# Patient Record
Sex: Male | Born: 1960
Health system: Southern US, Community
[De-identification: ages and names within clinical notes are randomized; demographics above are authoritative.]

## PROBLEM LIST (undated history)

## (undated) DIAGNOSIS — C801 Malignant (primary) neoplasm, unspecified: Secondary | ICD-10-CM

## (undated) HISTORY — PX: CERVICAL SPINE SURGERY: SHX589

---

## 2002-11-07 ENCOUNTER — Emergency Department (HOSPITAL_COMMUNITY): Admission: EM | Admit: 2002-11-07 | Discharge: 2002-11-07 | Payer: Self-pay | Admitting: Emergency Medicine

## 2002-11-07 ENCOUNTER — Encounter: Payer: Self-pay | Admitting: Emergency Medicine

## 2002-12-28 ENCOUNTER — Encounter: Payer: Self-pay | Admitting: Emergency Medicine

## 2002-12-28 ENCOUNTER — Emergency Department (HOSPITAL_COMMUNITY): Admission: EM | Admit: 2002-12-28 | Discharge: 2002-12-28 | Payer: Self-pay | Admitting: Emergency Medicine

## 2004-11-09 ENCOUNTER — Ambulatory Visit (HOSPITAL_COMMUNITY): Admission: RE | Admit: 2004-11-09 | Discharge: 2004-11-10 | Payer: Self-pay | Admitting: Neurosurgery

## 2005-06-11 ENCOUNTER — Inpatient Hospital Stay (HOSPITAL_COMMUNITY): Admission: RE | Admit: 2005-06-11 | Discharge: 2005-06-13 | Payer: Self-pay | Admitting: Urology

## 2005-06-11 ENCOUNTER — Encounter (INDEPENDENT_AMBULATORY_CARE_PROVIDER_SITE_OTHER): Payer: Self-pay | Admitting: *Deleted

## 2005-09-05 ENCOUNTER — Emergency Department (HOSPITAL_COMMUNITY): Admission: EM | Admit: 2005-09-05 | Discharge: 2005-09-05 | Payer: Self-pay | Admitting: Emergency Medicine

## 2005-10-07 DIAGNOSIS — C801 Malignant (primary) neoplasm, unspecified: Secondary | ICD-10-CM

## 2005-10-07 HISTORY — PX: NEPHRECTOMY: SHX65

## 2005-10-07 HISTORY — DX: Malignant (primary) neoplasm, unspecified: C80.1

## 2005-12-19 ENCOUNTER — Ambulatory Visit (HOSPITAL_COMMUNITY): Admission: RE | Admit: 2005-12-19 | Discharge: 2005-12-19 | Payer: Self-pay | Admitting: Urology

## 2006-03-21 ENCOUNTER — Ambulatory Visit (HOSPITAL_COMMUNITY): Admission: RE | Admit: 2006-03-21 | Discharge: 2006-03-21 | Payer: Self-pay | Admitting: Urology

## 2006-10-01 ENCOUNTER — Ambulatory Visit (HOSPITAL_COMMUNITY): Admission: RE | Admit: 2006-10-01 | Discharge: 2006-10-01 | Payer: Self-pay | Admitting: Urology

## 2008-08-18 ENCOUNTER — Ambulatory Visit (HOSPITAL_COMMUNITY): Admission: RE | Admit: 2008-08-18 | Discharge: 2008-08-18 | Payer: Self-pay | Admitting: Urology

## 2008-12-20 ENCOUNTER — Ambulatory Visit (HOSPITAL_COMMUNITY): Admission: RE | Admit: 2008-12-20 | Discharge: 2008-12-20 | Payer: Self-pay | Admitting: Urology

## 2009-10-03 ENCOUNTER — Ambulatory Visit (HOSPITAL_COMMUNITY): Admission: RE | Admit: 2009-10-03 | Discharge: 2009-10-03 | Payer: Self-pay | Admitting: Urology

## 2009-10-07 HISTORY — PX: EYE SURGERY: SHX253

## 2011-02-22 NOTE — H&P (Signed)
NAME:  Billy, Alexander NO.:  [id number]   MEDICAL RECORD NO.:  [id number]          PATIENT TYPE:  OIB   LOCATION:  2854                         FACILITY:  [facility]   PHYSICIAN:  Payton Doughty, M.D.      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   SERVICE:  Neurosurgery.   A 50 year old, right handed, white gentleman, ten days ago he woke up with  pain in his left shoulder and arm.  The pain has progressed down his arm.  He was in [place] and went to a chiropractor with some relief.  He has  numbness in his left thumb, pain down towards the left shoulder and numbness  in various spots in his arm.  The right side has not been effected nor have  the extremities.   MEDICAL HISTORY:  Benign.   He is on hydrocodone only.   He has no allergies.   He has had no operations.   FAMILY HISTORY:  Mom is 70.  Dad is 26.  Both in good health.   SOCIAL HISTORY:  He smokes a pack and a half of cigarettes a day.  He does  not drink alcohol, but he used to.   REVIEW OF SYSTEMS:  Remarkable for arm pain and neck pain.   PHYSICAL EXAMINATION:  HEENT:  Within normal limits.  NECK:  He has limited range of motion neck.  Turning it towards the left  reproduces left shoulder and arm pain.  CHEST:  Clear.  CARDIAC:  Regular rate and rhythm.  ABDOMEN:  Nontender with no hepatosplenomegaly.  EXTREMITIES:  Without clubbing or cyanosis.  Peripheral pulses are good.  GU:  Deferred.  NEUROLOGIC:  He is awake, alert, and oriented.  His cranial nerves are  intact.  Motor exam shows 5/5 strength throughout the upper and lower  extremities, save for the left biceps which is 5-/5.  He has a left C6  sensory deficit.  Reflexes are 2 at the right biceps, flicker at the left, 1  at the triceps bilaterally, 1 at the brachial radialis bilaterally.  Hoffmann's is negative and the lower extremities are non myelopathic.   __________  MRI shows mild degenerative changes at C3-C4 and C4-C5.  C5-C6  has a large herniated disk __________  the left side and in the left C6  neuroforamen with compression of the left C6 root.   CLINICAL IMPRESSION:  Left C6 radiculopathy secondary to herniated disk.   PLAN:  Anterior cervical decompression and fusion with a Reflex Hybrid  plate.  The risks and benefits of this approach have been discussed with him  and he wishes to proceed.      MWR/MEDQ  D:  11/09/2004  T:  11/09/2004  Job:  [id number]

## 2011-02-22 NOTE — Discharge Summary (Signed)
NAME:  Billy Alexander, Billy Alexander NO.:  [id number]   MEDICAL RECORD NO.:  [id number]          PATIENT TYPE:  INP   LOCATION:  1429                         FACILITY:  [facility]   PHYSICIAN:  Excell Seltzer. Annabell Howells, M.D.    DATE OF BIRTH:  05/13/1961   DATE OF ADMISSION:  06/11/2005  DATE OF DISCHARGE:  06/13/2005                                 DISCHARGE SUMMARY   DISCHARGE DIAGNOSIS:  Left renal mass.   PROCEDURE:  Left hand-assisted laparoscopic nephrectomy.   SURGEON:  Excell Seltzer. Annabell Howells, M.D.   CONSULTATIONS:  None.   HOSPITAL COURSE:  The patient was taken to the operating room on June 11, 2005, and underwent the above-named procedure, which he tolerated well  without complications.  For a complete description of this procedure, please  consult the operative note.  Postoperatively, he was taken to PACU and then  to the floor in stable condition, where he remained throughout his hospital  stay, without incident, which consisted of progressive toleration of  ambulation, toleration of pain, and diet.  On postoperative day number two,  it was determined that he was in stable condition to be discharged home.  On  examination at the time of discharge, the abdomen was soft, nondistended,  nontender to palpation, without costovertebral angle tenderness.  The  incision was clean, dry, and intact.  The staples were in place.  There was  no surrounding erythema or exudate.   DISCHARGE INSTRUCTIONS:  The patient was given routine wound care  instructions and instructed to call or return if he begins experiencing any  nausea, vomiting, fever, chills, redness, or drainage from his wound.  He  was instructed not to lift more than 10 pounds or strain for the next six  weeks.  He was instructed not to drive on narcotic pain medicine or if he  has abdominal soreness.  He was instructed to followup with Dr. Annabell Howells as  scheduled for wound check and staple removal.  He understands and agrees  with  these instructions.   DISCHARGE MEDICATIONS:  1.  Tylox.  2.  Colace.     ______________________________  Glade Nurse, MD      Excell Seltzer. Annabell Howells, M.D.  Electronically Signed    MT/MEDQ  D:  06/13/2005  T:  06/13/2005  Job:  [id number]

## 2011-02-22 NOTE — Op Note (Signed)
NAME:  Billy Alexander, Billy Alexander NO.:  [id number]   MEDICAL RECORD NO.:  [id number]          PATIENT TYPE:  INP   LOCATION:  X003                         FACILITY:  [facility]   PHYSICIAN:  Excell Seltzer. Annabell Howells, M.D.    DATE OF BIRTH:  Jan 11, 1961   DATE OF PROCEDURE:  06/11/2005  DATE OF DISCHARGE:                                 OPERATIVE REPORT   PREOPERATIVE DIAGNOSIS:  Left renal mass.   POSTOPERATIVE DIAGNOSIS:  Left renal mass.   OPERATION PERFORMED:  Left hand assisted laparoscopic nephrectomy.   SURGEON:  Excell Seltzer. Annabell Howells, M.D.   ASSISTANT:  Glade Nurse, MD   ANESTHESIA:  General endotracheal.   SPECIMENS:  Left kidney.   DRAINS:  None.   DESCRIPTION OF PROCEDURE:  The patient was identified by wrist bracelet and  brought to room 10 where he received preoperative antibiotics and was  prepped and draped in normal fashion.  Next, we made a 7.5 cm midline  incision just lateral to the left of the umbilicus.  We dissected down  through the dermis, subcutaneous fat.  The midline was identified and  divided with Bovie cautery.  We then identified the medial aspect of the  each rectus body and divided them bluntly.  We picked up on the  preperitoneal fat and peritoneum divided then with Metzenbaum scissors.  The  peritoneum was entered and the remainder of the incision was then opened.  Next, Gelport was placed and a pneumoperitoneum was established by placing a  trocar through the Gelport.  Next, we made a port site for a 12.5 mm port  along the midline just inferior to the xiphoid process.  We made a  transverse 0.5 cm incision, dissected down with Bovie cautery and hemostat.  We then placed a trocar, bluntly entered under direct visual guidance.  We  then repeated this process with a second 12.5 mm port one handbreadth  lateral to the Gelport on the left.  Then we made a transverse 0.5 cm  incision, dissected down with Bovie cautery and placed the trocar bluntly  under  direct visualization. Next, the camera was removed from the Gelport  and placed in the upper midline port site.  The surgeon's left hand was  placed through the Gelport.  Next, the patient was rolled listed to the  right.  Next, using cautery hook we scored the white line of Toldt from the  splenic flexure to the level of the pelvic brim.  The colon was then  reflected medially which was aided by the angle of the table.  Next, we  developed a plane bluntly between the edge that was just divided along the  white line of Told, developed a plane with the surgeon's hand, between the  colonic mesentery and the underlying Gerota's fascia.  This was done in a  blunt fashion extending from the white line of Toldt medially to the level  of the hilum and from the superior to the inferior pole of the kidney.  Next, we starting laterally, we divided across the lower pole tissue with a  Harmonic scalpel. We then  encountered the ureter, which was dissected out  with right angle and ligated with Weck clips and divided sharply.  Next, we  followed the course of the ureter toward the hilum. The gonadal vessels were  identified and divided with the Harmonic scalpel.  Next, we encountered the  lower pole artery and vein.  These were dissected out with right angle clamp  and were ligated with Weck clips placing two proximally and one clip  distally. They were each then divided with Metzenbaum scissors.  We then  continued blunt dissection cranial fashion with both the surgeon's left hand  and a Nezhat.  We then were able to identify the anterior renal vein as well  as a posterior lying single renal artery.  We were able to develop a window  circumferentially around the hilum and using a ATW-45 stapler, we ligated  and divided the hilum.  We then inspected the hilum and it was found to be  hemostatic.  Next, there were attachments remaining to the upper pole  superior to the adrenal gland.  These were taken down  bluntly and with  Harmonics scalpel.  At this point the entire kidney including the adrenal  gland and Gerota's fascia and proximal ureter was free. We were unable to  gently remove it through the hand port.  At this point under direct  visualization, we removed our 12 mm ports.  The port sites were hemostatic.  We then removed the Gelport and the kidney was easily removed and passed off  the field for pathologic analysis.  Following this, we inspected.  All  sponge counts were done.  Sponge and instrument counts were done and were  correct.  Next, we closed the fascia and the 12 mm ports with interrupted 0  Vicryls, staples were applied to the level of the skin.  Next, the midline  incision was closed with running #2 PDS being careful not to involve the  underlying bowel in the repair.  Staples were applied. Skin dressings were  applied.  The patient was reversed from his anesthesia which was tolerated  without complication.  Please note, Dr. Bjorn Pippin was present and  participated in all aspects of the case.     ______________________________  Glade Nurse, MD      Excell Seltzer. Annabell Howells, M.D.  Electronically Signed    MT/MEDQ  D:  06/11/2005  T:  06/11/2005  Job:  [id number]

## 2011-02-22 NOTE — Op Note (Signed)
NAME:  Billy Alexander, CROCHET NO.:  [id number]   MEDICAL RECORD NO.:  [id number]          PATIENT TYPE:  OIB   LOCATION:  3013                         FACILITY:  [facility]   PHYSICIAN:  Payton Doughty, M.D.      DATE OF BIRTH:  June 14, 1961   DATE OF PROCEDURE:  11/09/2004  DATE OF DISCHARGE:  11/10/2004                                 OPERATIVE REPORT   PREOPERATIVE DIAGNOSIS:  Herniated disk, C5-6, left.   POSTOPERATIVE DIAGNOSIS:  Herniated disk, C5-6, left.   PROCEDURE:  C5-C6 anterior cervical decompression and fusion with a Reflex  hybrid plate.   SURGEON:  Payton Doughty, M.D.   FIRST ASSISTANT:  Cristi Loron, M.D.   ASSISTANTBasilia Jumbo, Nurse Assistant.   ANESTHESIA:  General endotracheal anesthesia.   PREPARATION:  The patient was prepped with Hibiclens and alcohol wipes.   COMPLICATIONS:  None.   BODY OF TEXT:  A 50 year old gentleman, left C6 radiculopathy secondary to  herniated disk was taken to the operating room and after smooth induction  anesthetic, was intubated.   The patient was placed supine on the operating room table, with site shaved,  prepped and draped in the usual sterile fashion.   Skin was incised in the midline, medial border of the sternocleidomastoid  muscle on the left side.  The platysma was identified, divided and  undermined.  Sternocleidomastoid was then identified and medial dissection  revealed the carotid artery and retracted right and left, trachea and  esophagus retracted right, exposing the body of the anterior cervical spine.  Marker was placed  and intraoperative x-ray obtained to confirm correct  position and level, and after confirming correct level, diskectomy was  carried out under gross observation.   The operating microscope was then brought in and we used microdissection  technique to remove the remaining disk, divided the posterior longitudinal  ligament.  Under the ligament, way out in the left neural  foramina, were  large fragments of disk that were removed without difficulty.  This resulted  in immediate decompression of the nerve root.   Following  complete decompression, a 7-mm bone graft was fashioned from  patellar allograft and tapped into place.  A 14-mm Reflex hybrid plate was  then placed with 12-mm screws, two in C5 and two in C6.  Intraoperative x-  rays confirmed good placement of bone graft plate and screws.  The wound was  irrigated, hemostasis assured.  The  platysma, subcutaneous tissue was reapproximated 3-0 Vicryl interrupted  fashion. Skin was closed with 4-0 Vicryl in a running subcuticular fashion.  Benzoin and Steri-Strips were placed, as well as occlusive Telfa and OpSite  and the patient was placed in Aspen collar and returned to the recovery room  in good condition.      MWR/MEDQ  D:  11/09/2004  T:  11/10/2004  Job:  [id number]

## 2011-07-09 LAB — CREATININE, SERUM: Creatinine, Ser: 1.14

## 2011-09-14 ENCOUNTER — Ambulatory Visit (INDEPENDENT_AMBULATORY_CARE_PROVIDER_SITE_OTHER): Payer: Commercial Managed Care - PPO

## 2011-09-14 DIAGNOSIS — Z23 Encounter for immunization: Secondary | ICD-10-CM

## 2011-09-14 DIAGNOSIS — S61209A Unspecified open wound of unspecified finger without damage to nail, initial encounter: Secondary | ICD-10-CM

## 2012-02-11 ENCOUNTER — Other Ambulatory Visit: Payer: Self-pay | Admitting: Internal Medicine

## 2012-08-29 ENCOUNTER — Telehealth: Payer: Self-pay

## 2012-08-29 ENCOUNTER — Ambulatory Visit (INDEPENDENT_AMBULATORY_CARE_PROVIDER_SITE_OTHER): Payer: Commercial Managed Care - PPO | Admitting: Emergency Medicine

## 2012-08-29 VITALS — BP 135/82 | HR 66 | Temp 97.6°F | Resp 16 | Ht 70.5 in | Wt 182.0 lb

## 2012-08-29 DIAGNOSIS — J029 Acute pharyngitis, unspecified: Secondary | ICD-10-CM

## 2012-08-29 DIAGNOSIS — J40 Bronchitis, not specified as acute or chronic: Secondary | ICD-10-CM

## 2012-08-29 DIAGNOSIS — F17200 Nicotine dependence, unspecified, uncomplicated: Secondary | ICD-10-CM | POA: Insufficient documentation

## 2012-08-29 DIAGNOSIS — J329 Chronic sinusitis, unspecified: Secondary | ICD-10-CM

## 2012-08-29 MED ORDER — PREDNISONE 10 MG PO TABS: ORAL_TABLET | ORAL | Status: DC

## 2012-08-29 MED ORDER — HYDROCODONE-HOMATROPINE 5-1.5 MG/5ML PO SYRP: 5.0000 mL | ORAL_SOLUTION | Freq: Four times a day (QID) | ORAL | Status: DC | PRN

## 2012-08-29 MED ORDER — AMOXICILLIN-POT CLAVULANATE 875-125 MG PO TABS: 1.0000 | ORAL_TABLET | Freq: Two times a day (BID) | ORAL | Status: DC

## 2012-08-29 MED ORDER — METHYLPREDNISOLONE ACETATE 80 MG/ML IJ SUSP
80.0000 mg | Freq: Once | INTRAMUSCULAR | Status: AC
  Administered 2012-08-29: 80 mg via INTRAMUSCULAR

## 2012-08-29 NOTE — Telephone Encounter (Signed)
Patient was seen today and called back to ask doctor to call in prescription  For Prednisone.  [facility] [address] Please let pt know when done.  [phone]

## 2012-08-29 NOTE — Telephone Encounter (Signed)
No, Dr Cleta Alberts has decided after further review to go ahead and send in the prednisone taper but he wants prednisone 10mg  6 5 4 3 2 1  this is sent in for him, I will advise him when he calls back.

## 2012-08-29 NOTE — Progress Notes (Signed)
  Subjective:    Patient ID: KAMALI NEPHEW, male    DOB: 03-06-1961, 51 y.o.   MRN: [id number]  HPI Patient enters with onset Wednesday. Patient started with a sore throat then developed significant nasal congestion with pain around his right eye and into his right ear. Had mainly clear drainage with some yellowish tinge. He has developed a productive cough. He has a history of seasonal allergies and has a lot of difficulty this time a year. Prior to the onset of symptoms he is cutting down needs and then developed the symptoms he described this morning. He is a heavy cigarette smoker and has no intentions of quitting.   Review of Systems     Objective:   Physical Exam HEENT exam reveals an alert cooperative male in no distress. The left TM is normal the right TM is normal the nose is congested with some purulence. The posterior pharynx is red with some redness of the uvula. There is no cervical adenopathy his chest exam was clear to auscultation and percussion.  Results for orders placed in visit on 08/29/12  POCT RAPID STREP A (OFFICE)      Component Value Range   Rapid Strep A Screen Negative  Negative        Assessment & Plan:  Patient here with allergic rhinitis and secondary sinusitis. We'll give 80 of Depo-Medrol IM followed by Augmentin and hydrocodone for cough. As discussed above he has no intentions to trying to quit smoking

## 2012-08-29 NOTE — Telephone Encounter (Signed)
I have spoken to Dr Cleta Alberts. He wants patient to try to wait 2 days, on ABX and after Depo Medrol to see how he is prior to starting prednisone taper, if patient is not better, he is to call me back and I can send in prednisone 20 mg taper, [phone] Billy Alexander    Left message for patient to call back so I can advise.

## 2012-08-29 NOTE — Patient Instructions (Signed)
Please return to clinic for reevaluation if not better in 2-3 days.

## 2012-08-30 NOTE — Telephone Encounter (Signed)
patient notified and voiced understanding. 

## 2012-08-30 NOTE — Telephone Encounter (Signed)
Left message for patient to return call.

## 2012-09-07 ENCOUNTER — Telehealth: Payer: Self-pay

## 2012-09-07 NOTE — Telephone Encounter (Signed)
Pt states saw dr Cleta Alberts for illness recently. States its in his chest now and asks for something to help.   [facility] [address]  [phone]  bf

## 2012-09-07 NOTE — Telephone Encounter (Signed)
Patient was here 11/23 treated with Augmentin, states he is coughing more now. He finished all antibiotics and has used all of cough meds, he wants another round of antibiotics. He states he is already taking Mucinex/ please advise.

## 2012-09-07 NOTE — Telephone Encounter (Signed)
If he is worsening or not improved after abx and steroids, he needs to RTC for further evaluation

## 2012-09-07 NOTE — Telephone Encounter (Signed)
Patient advised.

## 2013-07-12 ENCOUNTER — Ambulatory Visit: Payer: 59

## 2013-07-12 ENCOUNTER — Ambulatory Visit (INDEPENDENT_AMBULATORY_CARE_PROVIDER_SITE_OTHER): Payer: 59 | Admitting: Family Medicine

## 2013-07-12 VITALS — BP 128/80 | HR 70 | Temp 98.7°F | Resp 16 | Ht 70.5 in | Wt 184.2 lb

## 2013-07-12 DIAGNOSIS — J069 Acute upper respiratory infection, unspecified: Secondary | ICD-10-CM

## 2013-07-12 DIAGNOSIS — M549 Dorsalgia, unspecified: Secondary | ICD-10-CM

## 2013-07-12 MED ORDER — DICLOFENAC SODIUM 75 MG PO TBEC: 75.0000 mg | DELAYED_RELEASE_TABLET | Freq: Every day | ORAL | Status: DC

## 2013-07-12 MED ORDER — PREDNISONE 20 MG PO TABS: ORAL_TABLET | ORAL | Status: DC

## 2013-07-12 MED ORDER — METHYLPREDNISOLONE ACETATE 80 MG/ML IJ SUSP
80.0000 mg | Freq: Once | INTRAMUSCULAR | Status: AC
  Administered 2013-07-12: 80 mg via INTRAMUSCULAR

## 2013-07-12 MED ORDER — AMOXICILLIN 875 MG PO TABS: 875.0000 mg | ORAL_TABLET | Freq: Two times a day (BID) | ORAL | Status: DC

## 2013-07-12 NOTE — Patient Instructions (Addendum)
I hope that you feel better.  Start taking the oral prednisone tomorrow.  Do not take diclofenac or other NSAID medications while you are taking prednisone.    Please work on quitting smoking

## 2013-07-12 NOTE — Progress Notes (Signed)
[facility] [address] [place] [address] [phone]  Date:  07/12/2013   Name:  Billy Alexander   DOB:  1960-12-04   MRN:  [id number]  PCP:  Nilda Simmer, MD    Chief Complaint: Allergies   History of Present Illness:  Billy Alexander is a 52 y.o. very pleasant male patient who presents with the following:  "I have allergies.  I get this every year."   Very early yesterday am he noted sneezing, congestion in his right ear.  "I know what I need and if you don't give it to me I'll just be calling back."  He tried benadryl, but it seemed to keep him up last night.  He has not noted a fever or chill No GI symptoms Insists that he must have a shot of steroids, antibiotics ("at least 750 of amox") and oral prednisone.    He also requests diclofenac for inflammation.  He takes 75 once day for pain in his back, neck.  Requests a years rx  Patient Active Problem List   Diagnosis Date Noted  . Smoker 08/29/2012    History reviewed. No pertinent past medical history.  History reviewed. No pertinent past surgical history.  History  Substance Use Topics  . Smoking status: Current Every Day Smoker  . Smokeless tobacco: Not on file  . Alcohol Use: Not on file    History reviewed. No pertinent family history.  No Known Allergies  Medication list has been reviewed and updated.  Current Outpatient Prescriptions on File Prior to Visit  Medication Sig Dispense Refill  . amoxicillin-clavulanate (AUGMENTIN) 875-125 MG per tablet Take 1 tablet by mouth 2 (two) times daily.  20 tablet  0  . HYDROcodone-homatropine (HYCODAN) 5-1.5 MG/5ML syrup Take 5 mLs by mouth every 6 (six) hours as needed for cough.  120 mL  0  . predniSONE (DELTASONE) 10 MG tablet 6/ 5/ 4/ 3 /2/ 1  21 tablet  0  . VIAGRA 100 MG tablet TAKE 1/2 TABLET 1 HOUR PRIOR TO NEED  10 tablet  0   No current facility-administered medications on file prior to visit.    Review of  Systems:  As per HPI- otherwise negative.   Physical Examination: Filed Vitals:   07/12/13 0750  BP: 128/80  Pulse: 70  Temp: 98.7 F (37.1 C)  Resp: 16   Filed Vitals:   07/12/13 0750  Height: 5' 10.5" (1.791 m)  Weight: 184 lb 3.2 oz (83.553 kg)   Body mass index is 26.05 kg/(m^2). Ideal Body Weight: Weight in (lb) to have BMI = 25: 176.4  GEN: WDWN, NAD, Non-toxic, A & O x 3, looks well, tobacco odor HEENT: Atraumatic, Normocephalic. Neck supple. No masses, No LAD.  Bilateral TM wnl, oropharynx normal.  PEERL,EOMI.  Nasal cavity normal Ears and Nose: No external deformity. CV: RRR, No M/G/R. No JVD. No thrill. No extra heart sounds. PULM: CTA B, no wheezes, crackles, rhonchi. No retractions. No resp. distress. No accessory muscle use. EXTR: No c/c/e NEURO Normal gait.  PSYCH: Normally interactive. Conversant. Not depressed or anxious appearing.  Calm demeanor.    Assessment and Plan: Upper respiratory infection - Plan: amoxicillin (AMOXIL) 875 MG tablet, predniSONE (DELTASONE) 20 MG tablet, methylPREDNISolone acetate (DEPO-MEDROL) injection 80 mg  Back pain - Plan: diclofenac (VOLTAREN) 75 MG EC tablet  Pt here with one day of sneezing.  Insistent that he must have depo- medrol, prednisone and amoxicillin.  Treated as per  his request.    Signed Abbe Amsterdam, MD

## 2013-09-15 ENCOUNTER — Ambulatory Visit (INDEPENDENT_AMBULATORY_CARE_PROVIDER_SITE_OTHER): Payer: 59 | Admitting: Emergency Medicine

## 2013-09-15 VITALS — BP 135/72 | HR 64 | Temp 98.2°F | Resp 18 | Wt 191.0 lb

## 2013-09-15 DIAGNOSIS — J329 Chronic sinusitis, unspecified: Secondary | ICD-10-CM

## 2013-09-15 DIAGNOSIS — J069 Acute upper respiratory infection, unspecified: Secondary | ICD-10-CM

## 2013-09-15 MED ORDER — PREDNISONE 20 MG PO TABS: ORAL_TABLET | ORAL | Status: DC

## 2013-09-15 MED ORDER — AMOXICILLIN 875 MG PO TABS: 875.0000 mg | ORAL_TABLET | Freq: Two times a day (BID) | ORAL | Status: DC

## 2013-09-15 MED ORDER — FLUTICASONE PROPIONATE 50 MCG/ACT NA SUSP: 2.0000 | Freq: Every day | NASAL | Status: DC

## 2013-09-15 NOTE — Patient Instructions (Signed)
Allergic Rhinitis Allergic rhinitis is when the mucous membranes in the nose respond to allergens. Allergens are particles in the air that cause your body to have an allergic reaction. This causes you to release allergic antibodies. Through a chain of events, these eventually cause you to release histamine into the blood stream (hence the use of antihistamines). Although meant to be protective to the body, it is this release that causes your discomfort, such as frequent sneezing, congestion and an itchy runny nose.  CAUSES  The pollen allergens may come from grasses, trees, and weeds. This is seasonal allergic rhinitis, or "hay fever." Other allergens cause year-round allergic rhinitis (perennial allergic rhinitis) such as house dust mite allergen, pet dander and mold spores.  SYMPTOMS   Nasal stuffiness (congestion).  Runny, itchy nose with sneezing and tearing of the eyes.  There is often an itching of the mouth, eyes and ears. It cannot be cured, but it can be controlled with medications. DIAGNOSIS  If you are unable to determine the offending allergen, skin or blood testing may find it. TREATMENT   Avoid the allergen.  Medications and allergy shots (immunotherapy) can help.  Hay fever may often be treated with antihistamines in pill or nasal spray forms. Antihistamines block the effects of histamine. There are over-the-counter medicines that may help with nasal congestion and swelling around the eyes. Check with your caregiver before taking or giving this medicine. If the treatment above does not work, there are many new medications your caregiver can prescribe. Stronger medications may be used if initial measures are ineffective. Desensitizing injections can be used if medications and avoidance fails. Desensitization is when a patient is given ongoing shots until the body becomes less sensitive to the allergen. Make sure you follow up with your caregiver if problems continue. SEEK MEDICAL  CARE IF:   You develop fever (more than 100.5 F (38.1 C).  You develop a cough that does not stop easily (persistent).  You have shortness of breath.  You start wheezing.  Symptoms interfere with normal daily activities. Document Released: 06/18/2001 Document Revised: 12/16/2011 Document Reviewed: 12/28/2008 [employer] Patient Information 2014 [employer].  

## 2013-09-15 NOTE — Progress Notes (Signed)
   Subjective:    Patient ID: Billy Alexander, male    DOB: Dec 02, 1960, 52 y.o.   MRN: [id number]  HPI Starting Wednesday, right ear is painful and he is having sinus problems. His hearing is bad in that ear anyway. Not having any nasal drainage. +sneezing. He states he is trying to "catch" it before it gets too bad. He is a smoker. No history of lung CA in his family. He uses flonase when he gets really bad. He took a couple claritin this morning. States that if he does not been on prednisone immediately he usually becomes very ill. He is requesting a shot of Depo-Medrol    Review of Systems     Objective:   Physical Exam patient is alert and cooperative he is not ill-appearing. His neck is supple. Chest is symmetrical expansion there are dry rales on the left here turbinates are swollen and congested. His TMs did not show any signs of infection .        Assessment & Plan:  We'll treat with amoxicillin taper dose of prednisone. I did encourage him to consider stopping smoking he has no intention of doing that. He was given Flonase to use a regular basis.

## 2013-12-03 ENCOUNTER — Other Ambulatory Visit (HOSPITAL_COMMUNITY): Payer: Self-pay | Admitting: Orthopaedic Surgery

## 2013-12-03 ENCOUNTER — Ambulatory Visit (HOSPITAL_COMMUNITY): Payer: 59

## 2013-12-03 ENCOUNTER — Other Ambulatory Visit: Payer: Self-pay | Admitting: Orthopaedic Surgery

## 2013-12-03 DIAGNOSIS — M542 Cervicalgia: Secondary | ICD-10-CM

## 2013-12-04 ENCOUNTER — Ambulatory Visit
Admission: RE | Admit: 2013-12-04 | Discharge: 2013-12-04 | Disposition: A | Discharge status: Home / Self Care | Payer: 59 | Source: Ambulatory Visit | Attending: Orthopaedic Surgery | Admitting: Orthopaedic Surgery

## 2013-12-04 ENCOUNTER — Other Ambulatory Visit: Payer: Self-pay

## 2013-12-04 ENCOUNTER — Other Ambulatory Visit: Payer: 59

## 2013-12-04 DIAGNOSIS — M542 Cervicalgia: Secondary | ICD-10-CM

## 2013-12-14 ENCOUNTER — Ambulatory Visit (HOSPITAL_COMMUNITY): Payer: Self-pay

## 2014-02-16 ENCOUNTER — Other Ambulatory Visit: Payer: Self-pay | Admitting: Neurosurgery

## 2014-02-16 DIAGNOSIS — M549 Dorsalgia, unspecified: Secondary | ICD-10-CM

## 2014-02-18 ENCOUNTER — Ambulatory Visit
Admission: RE | Admit: 2014-02-18 | Discharge: 2014-02-18 | Disposition: A | Discharge status: Home / Self Care | Payer: 59 | Source: Ambulatory Visit | Attending: Neurosurgery | Admitting: Neurosurgery

## 2014-02-18 DIAGNOSIS — M549 Dorsalgia, unspecified: Secondary | ICD-10-CM

## 2014-02-23 ENCOUNTER — Other Ambulatory Visit: Payer: Self-pay | Admitting: Neurosurgery

## 2014-03-08 ENCOUNTER — Encounter (HOSPITAL_COMMUNITY): Payer: Self-pay

## 2014-03-09 ENCOUNTER — Other Ambulatory Visit (HOSPITAL_COMMUNITY): Payer: Self-pay | Admitting: *Deleted

## 2014-03-09 NOTE — Pre-Procedure Instructions (Signed)
Billy Alexander  03/09/2014   Your procedure is scheduled on:  Friday, March 18, 2014 at 7:30 AM.   Report to [facility] Entrance "A" Admitting Office at 5:30 AM.   Call this number if you have problems the morning of surgery: [phone]   Remember:   Do not eat food or drink liquids after midnight Thursday, 03/18/14.   Take these medicines the morning of surgery with A SIP OF WATER: Flonase - if needed, Artificial Tears - if needed.   Do not wear jewelry.  Do not wear lotions, powders, or cologne. You may wear deodorant.  Men may shave face and neck.  Do not bring valuables to the hospital.  [facility] is not responsible                  for any belongings or valuables.               Contacts, dentures or bridgework may not be worn into surgery.  Leave suitcase in the car. After surgery it may be brought to your room.  For patients admitted to the hospital, discharge time is determined by your                treatment team.          Special Instructions: [place] - Preparing for Surgery  Before surgery, you can play an important role.  Because skin is not sterile, your skin needs to be as free of germs as possible.  You can reduce the number of germs on you skin by washing with CHG (chlorahexidine gluconate) soap before surgery.  CHG is an antiseptic cleaner which kills germs and bonds with the skin to continue killing germs even after washing.  Please DO NOT use if you have an allergy to CHG or antibacterial soaps.  If your skin becomes reddened/irritated stop using the CHG and inform your nurse when you arrive at Short Stay.  Do not shave (including legs and underarms) for at least 48 hours prior to the first CHG shower.  You may shave your face.  Please follow these instructions carefully:   1.  Shower with CHG Soap the night before surgery and the                                morning of Surgery.  2.  If you choose to wash your hair, wash your hair first as usual  with your       normal shampoo.  3.  After you shampoo, rinse your hair and body thoroughly to remove the                      Shampoo.  4.  Use CHG as you would any other liquid soap.  You can apply chg directly       to the skin and wash gently with scrungie or a clean washcloth.  5.  Apply the CHG Soap to your body ONLY FROM THE NECK DOWN.        Do not use on open wounds or open sores.  Avoid contact with your eyes, ears, mouth and genitals (private parts).  Wash genitals (private parts) with your normal soap.  6.  Wash thoroughly, paying special attention to the area where your surgery        will be performed.  7.  Thoroughly rinse your body with warm  water from the neck down.  8.  DO NOT shower/wash with your normal soap after using and rinsing off       the CHG Soap.  9.  Pat yourself dry with a clean towel.            10.  Wear clean pajamas.            11.  Place clean sheets on your bed the night of your first shower and do not        sleep with pets.  Day of Surgery  Do not apply any lotions the morning of surgery.  Please wear clean clothes to the hospital/surgery center.     Please read over the following fact sheets that you were given: Pain Booklet, Coughing and Deep Breathing, Blood Transfusion Information, MRSA Information and Surgical Site Infection Prevention

## 2014-03-10 ENCOUNTER — Encounter (HOSPITAL_COMMUNITY)
Admission: RE | Admit: 2014-03-10 | Discharge: 2014-03-10 | Disposition: A | Discharge status: Home / Self Care | Payer: 59 | Source: Ambulatory Visit | Attending: Neurosurgery | Admitting: Neurosurgery

## 2014-03-10 ENCOUNTER — Encounter (HOSPITAL_COMMUNITY): Payer: Self-pay

## 2014-03-10 DIAGNOSIS — Z01812 Encounter for preprocedural laboratory examination: Secondary | ICD-10-CM | POA: Insufficient documentation

## 2014-03-10 HISTORY — DX: Malignant (primary) neoplasm, unspecified: C80.1

## 2014-03-10 LAB — BASIC METABOLIC PANEL
BUN: 12 mg/dL (ref 6–23)
CO2: 25 mEq/L (ref 19–32)
Calcium: 9.8 mg/dL (ref 8.4–10.5)
Chloride: 104 mEq/L (ref 96–112)
Creatinine, Ser: 0.83 mg/dL (ref 0.50–1.35)
Glucose, Bld: 106 mg/dL — ABNORMAL HIGH (ref 70–99)
POTASSIUM: 4.8 meq/L (ref 3.7–5.3)
Sodium: 143 mEq/L (ref 137–147)

## 2014-03-10 LAB — CBC
HCT: 45.6 % (ref 39.0–52.0)
HEMOGLOBIN: 15.4 g/dL (ref 13.0–17.0)
MCH: 32.1 pg (ref 26.0–34.0)
MCHC: 33.8 g/dL (ref 30.0–36.0)
MCV: 95 fL (ref 78.0–100.0)
Platelets: 235 10*3/uL (ref 150–400)
RBC: 4.8 MIL/uL (ref 4.22–5.81)
RDW: 13.1 % (ref 11.5–15.5)
WBC: 5.8 10*3/uL (ref 4.0–10.5)

## 2014-03-10 LAB — TYPE AND SCREEN
ABO/RH(D): O NEG
Antibody Screen: NEGATIVE

## 2014-03-10 LAB — ABO/RH: ABO/RH(D): O NEG

## 2014-03-10 LAB — SURGICAL PCR SCREEN
MRSA, PCR: NEGATIVE
STAPHYLOCOCCUS AUREUS: NEGATIVE

## 2014-03-17 MED ORDER — CEFAZOLIN SODIUM-DEXTROSE 2-3 GM-% IV SOLR
2.0000 g | INTRAVENOUS | Status: AC
  Administered 2014-03-18: 2 g via INTRAVENOUS
  Filled 2014-03-17: qty 50

## 2014-03-17 MED ORDER — DEXAMETHASONE SODIUM PHOSPHATE 10 MG/ML IJ SOLN
10.0000 mg | INTRAMUSCULAR | Status: AC
  Administered 2014-03-18: 10 mg via INTRAVENOUS
  Filled 2014-03-17: qty 1

## 2014-03-18 ENCOUNTER — Encounter (HOSPITAL_COMMUNITY)
Admission: RE | Disposition: A | Discharge status: Home / Self Care | Payer: Self-pay | Source: Ambulatory Visit | Attending: Neurosurgery

## 2014-03-18 ENCOUNTER — Inpatient Hospital Stay (HOSPITAL_COMMUNITY): Payer: 59 | Admitting: Anesthesiology

## 2014-03-18 ENCOUNTER — Encounter (HOSPITAL_COMMUNITY): Payer: Self-pay | Admitting: *Deleted

## 2014-03-18 ENCOUNTER — Inpatient Hospital Stay (HOSPITAL_COMMUNITY): Payer: 59

## 2014-03-18 ENCOUNTER — Encounter (HOSPITAL_COMMUNITY): Payer: 59 | Admitting: Anesthesiology

## 2014-03-18 ENCOUNTER — Inpatient Hospital Stay (HOSPITAL_COMMUNITY)
Admission: RE | Admit: 2014-03-18 | Discharge: 2014-03-21 | DRG: 458 | Disposition: A | Discharge status: Home / Self Care | Payer: 59 | Source: Ambulatory Visit | Attending: Neurosurgery | Admitting: Neurosurgery

## 2014-03-18 DIAGNOSIS — M48061 Spinal stenosis, lumbar region without neurogenic claudication: Secondary | ICD-10-CM | POA: Diagnosis present

## 2014-03-18 DIAGNOSIS — M4120 Other idiopathic scoliosis, site unspecified: Principal | ICD-10-CM | POA: Diagnosis present

## 2014-03-18 DIAGNOSIS — M419 Scoliosis, unspecified: Secondary | ICD-10-CM | POA: Diagnosis present

## 2014-03-18 DIAGNOSIS — F17200 Nicotine dependence, unspecified, uncomplicated: Secondary | ICD-10-CM | POA: Diagnosis present

## 2014-03-18 DIAGNOSIS — Z981 Arthrodesis status: Secondary | ICD-10-CM

## 2014-03-18 SURGERY — POSTERIOR LUMBAR FUSION 3 LEVEL
Anesthesia: General | Site: Back

## 2014-03-18 MED ORDER — SUCCINYLCHOLINE CHLORIDE 20 MG/ML IJ SOLN
INTRAMUSCULAR | Status: AC
  Filled 2014-03-18: qty 1

## 2014-03-18 MED ORDER — FENTANYL CITRATE 0.05 MG/ML IJ SOLN
INTRAMUSCULAR | Status: AC
  Filled 2014-03-18: qty 5

## 2014-03-18 MED ORDER — NEOSTIGMINE METHYLSULFATE 10 MG/10ML IV SOLN
INTRAVENOUS | Status: DC | PRN
  Administered 2014-03-18: 2.5 mg via INTRAVENOUS

## 2014-03-18 MED ORDER — CEFAZOLIN SODIUM-DEXTROSE 2-3 GM-% IV SOLR
INTRAVENOUS | Status: DC | PRN
  Administered 2014-03-18: 2 g via INTRAVENOUS

## 2014-03-18 MED ORDER — HYDROMORPHONE HCL PF 1 MG/ML IJ SOLN
0.5000 mg | INTRAMUSCULAR | Status: DC | PRN
  Administered 2014-03-18: 0.5 mg via INTRAVENOUS

## 2014-03-18 MED ORDER — HYDROMORPHONE HCL PF 1 MG/ML IJ SOLN
0.2500 mg | INTRAMUSCULAR | Status: DC | PRN
  Administered 2014-03-18 (×5): 0.5 mg via INTRAVENOUS

## 2014-03-18 MED ORDER — LIDOCAINE HCL (CARDIAC) 20 MG/ML IV SOLN
INTRAVENOUS | Status: AC
  Filled 2014-03-18: qty 5

## 2014-03-18 MED ORDER — FENTANYL CITRATE 0.05 MG/ML IJ SOLN
Freq: Once | INTRAMUSCULAR | Status: DC
  Filled 2014-03-18: qty 200

## 2014-03-18 MED ORDER — PROPOFOL 10 MG/ML IV BOLUS
INTRAVENOUS | Status: DC | PRN
  Administered 2014-03-18: 50 mg via INTRAVENOUS
  Administered 2014-03-18: 150 mg via INTRAVENOUS

## 2014-03-18 MED ORDER — SODIUM CHLORIDE 0.9 % IV SOLN: 250.0000 mL | INTRAVENOUS | Status: DC

## 2014-03-18 MED ORDER — SODIUM CHLORIDE 0.9 % IR SOLN
Status: DC | PRN
  Administered 2014-03-18 (×2)

## 2014-03-18 MED ORDER — FLUTICASONE PROPIONATE 50 MCG/ACT NA SUSP: 2.0000 | Freq: Every day | NASAL | Status: DC | PRN

## 2014-03-18 MED ORDER — ONDANSETRON HCL 4 MG/2ML IJ SOLN
4.0000 mg | INTRAMUSCULAR | Status: DC | PRN
  Administered 2014-03-19: 4 mg via INTRAVENOUS
  Filled 2014-03-18: qty 2

## 2014-03-18 MED ORDER — DIAZEPAM 5 MG/ML IJ SOLN
2.0000 mg | Freq: Three times a day (TID) | INTRAMUSCULAR | Status: DC | PRN
  Administered 2014-03-18: 2 mg via INTRAVENOUS

## 2014-03-18 MED ORDER — BUPIVACAINE HCL (PF) 0.5 % IJ SOLN
INTRAMUSCULAR | Status: DC | PRN
  Administered 2014-03-18: 20 mL

## 2014-03-18 MED ORDER — MENTHOL 3 MG MT LOZG: 1.0000 | LOZENGE | OROMUCOSAL | Status: DC | PRN

## 2014-03-18 MED ORDER — CYCLOBENZAPRINE HCL 10 MG PO TABS
ORAL_TABLET | ORAL | Status: AC
Start: 2014-03-18 — End: 2014-03-19
  Filled 2014-03-18: qty 1

## 2014-03-18 MED ORDER — PHENOL 1.4 % MT LIQD: 1.0000 | OROMUCOSAL | Status: DC | PRN

## 2014-03-18 MED ORDER — CEFAZOLIN SODIUM-DEXTROSE 2-3 GM-% IV SOLR
2.0000 g | Freq: Three times a day (TID) | INTRAVENOUS | Status: AC
  Administered 2014-03-18 – 2014-03-19 (×2): 2 g via INTRAVENOUS
  Filled 2014-03-18 (×2): qty 50

## 2014-03-18 MED ORDER — SODIUM CHLORIDE 0.9 % IV SOLN: 2500.0000 ug | INTRAVENOUS | Status: DC | PRN

## 2014-03-18 MED ORDER — ALBUMIN HUMAN 5 % IV SOLN
INTRAVENOUS | Status: DC | PRN
  Administered 2014-03-18: 11:00:00 via INTRAVENOUS

## 2014-03-18 MED ORDER — VECURONIUM BROMIDE 10 MG IV SOLR
INTRAVENOUS | Status: AC
  Filled 2014-03-18: qty 20

## 2014-03-18 MED ORDER — ACETAMINOPHEN 650 MG RE SUPP: 650.0000 mg | RECTAL | Status: DC | PRN

## 2014-03-18 MED ORDER — HYDROMORPHONE HCL PF 1 MG/ML IJ SOLN
1.0000 mg | INTRAMUSCULAR | Status: DC | PRN
  Administered 2014-03-18 – 2014-03-19 (×5): 1.5 mg via INTRAMUSCULAR
  Filled 2014-03-18 (×4): qty 2
  Filled 2014-03-18: qty 1
  Filled 2014-03-18: qty 2

## 2014-03-18 MED ORDER — GLYCOPYRROLATE 0.2 MG/ML IJ SOLN
INTRAMUSCULAR | Status: DC | PRN
  Administered 2014-03-18: 0.4 mg via INTRAVENOUS

## 2014-03-18 MED ORDER — CYCLOBENZAPRINE HCL 10 MG PO TABS
10.0000 mg | ORAL_TABLET | Freq: Three times a day (TID) | ORAL | Status: DC | PRN
  Administered 2014-03-18 – 2014-03-19 (×3): 10 mg via ORAL
  Filled 2014-03-18 (×2): qty 1

## 2014-03-18 MED ORDER — MIDAZOLAM HCL 2 MG/2ML IJ SOLN
INTRAMUSCULAR | Status: AC
  Filled 2014-03-18: qty 2

## 2014-03-18 MED ORDER — VECURONIUM BROMIDE 10 MG IV SOLR
INTRAVENOUS | Status: AC
  Filled 2014-03-18: qty 10

## 2014-03-18 MED ORDER — SODIUM CHLORIDE 0.9 % IV SOLN
INTRAVENOUS | Status: DC | PRN
  Administered 2014-03-18 (×4): via INTRAVENOUS

## 2014-03-18 MED ORDER — FENTANYL CITRATE 0.05 MG/ML IJ SOLN
INTRAMUSCULAR | Status: DC | PRN
  Administered 2014-03-18: 50 ug via INTRAVENOUS
  Administered 2014-03-18: 100 ug via INTRAVENOUS
  Administered 2014-03-18: 1250 ug via INTRAVENOUS
  Administered 2014-03-18: 250 ug via INTRAVENOUS
  Administered 2014-03-18 (×2): 50 ug via INTRAVENOUS

## 2014-03-18 MED ORDER — OXYCODONE-ACETAMINOPHEN 5-325 MG PO TABS
1.0000 | ORAL_TABLET | ORAL | Status: DC | PRN
Start: 2014-03-18 — End: 2014-03-21
  Administered 2014-03-18 – 2014-03-19 (×6): 2 via ORAL
  Filled 2014-03-18 (×7): qty 2

## 2014-03-18 MED ORDER — LACTATED RINGERS IV SOLN
INTRAVENOUS | Status: DC | PRN
  Administered 2014-03-18 (×4): via INTRAVENOUS

## 2014-03-18 MED ORDER — 0.9 % SODIUM CHLORIDE (POUR BTL) OPTIME
TOPICAL | Status: DC | PRN
  Administered 2014-03-18: 1000 mL

## 2014-03-18 MED ORDER — HYDROMORPHONE HCL PF 1 MG/ML IJ SOLN
INTRAMUSCULAR | Status: AC
  Administered 2014-03-18: 1 mg
  Filled 2014-03-18: qty 2

## 2014-03-18 MED ORDER — HYDROMORPHONE HCL PF 1 MG/ML IJ SOLN
INTRAMUSCULAR | Status: AC
  Filled 2014-03-18: qty 1

## 2014-03-18 MED ORDER — LIDOCAINE HCL (CARDIAC) 20 MG/ML IV SOLN
INTRAVENOUS | Status: DC | PRN
  Administered 2014-03-18: 60 mg via INTRAVENOUS

## 2014-03-18 MED ORDER — VECURONIUM BROMIDE 10 MG IV SOLR
INTRAVENOUS | Status: DC | PRN
  Administered 2014-03-18: 5 mg via INTRAVENOUS
  Administered 2014-03-18 (×3): 2 mg via INTRAVENOUS
  Administered 2014-03-18: 5 mg via INTRAVENOUS
  Administered 2014-03-18: 16 mg via INTRAVENOUS

## 2014-03-18 MED ORDER — DIAZEPAM 5 MG/ML IJ SOLN
INTRAMUSCULAR | Status: AC
  Filled 2014-03-18: qty 2

## 2014-03-18 MED ORDER — SUCCINYLCHOLINE CHLORIDE 20 MG/ML IJ SOLN
INTRAMUSCULAR | Status: DC | PRN
  Administered 2014-03-18: 120 mg via INTRAVENOUS

## 2014-03-18 MED ORDER — PANTOPRAZOLE SODIUM 40 MG IV SOLR
40.0000 mg | Freq: Every day | INTRAVENOUS | Status: DC
  Filled 2014-03-18 (×2): qty 40

## 2014-03-18 MED ORDER — SODIUM CHLORIDE 0.9 % IJ SOLN
3.0000 mL | Freq: Two times a day (BID) | INTRAMUSCULAR | Status: DC
  Administered 2014-03-19 – 2014-03-21 (×4): 3 mL via INTRAVENOUS

## 2014-03-18 MED ORDER — ACETAMINOPHEN 325 MG PO TABS
650.0000 mg | ORAL_TABLET | ORAL | Status: DC | PRN
  Administered 2014-03-19: 650 mg via ORAL
  Filled 2014-03-18: qty 2

## 2014-03-18 MED ORDER — SODIUM CHLORIDE 0.9 % IJ SOLN: 3.0000 mL | INTRAMUSCULAR | Status: DC | PRN

## 2014-03-18 MED ORDER — STERILE WATER FOR INJECTION IJ SOLN
INTRAMUSCULAR | Status: AC
  Filled 2014-03-18: qty 10

## 2014-03-18 MED ORDER — GLYCOPYRROLATE 0.2 MG/ML IJ SOLN
INTRAMUSCULAR | Status: AC
  Filled 2014-03-18: qty 2

## 2014-03-18 MED ORDER — FENTANYL CITRATE 0.05 MG/ML IJ SOLN: 2500.0000 ug | INTRAMUSCULAR | Status: DC | PRN

## 2014-03-18 MED ORDER — SODIUM CHLORIDE 0.9 % IV SOLN
INTRAVENOUS | Status: DC
  Filled 2014-03-18 (×14): qty 200

## 2014-03-18 MED ORDER — KCL IN DEXTROSE-NACL 20-5-0.45 MEQ/L-%-% IV SOLN
80.0000 mL/h | INTRAVENOUS | Status: DC
  Administered 2014-03-18 – 2014-03-19 (×3): 80 mL/h via INTRAVENOUS
  Filled 2014-03-18 (×7): qty 1000

## 2014-03-18 MED ORDER — MIDAZOLAM HCL 2 MG/2ML IJ SOLN
INTRAMUSCULAR | Status: DC | PRN
  Administered 2014-03-18: 2 mg via INTRAVENOUS

## 2014-03-18 MED ORDER — PROPOFOL 10 MG/ML IV BOLUS
INTRAVENOUS | Status: AC
  Filled 2014-03-18: qty 20

## 2014-03-18 MED ORDER — ROCURONIUM BROMIDE 50 MG/5ML IV SOLN
INTRAVENOUS | Status: AC
  Filled 2014-03-18: qty 1

## 2014-03-18 MED ORDER — SODIUM CHLORIDE 0.9 % IJ SOLN
INTRAMUSCULAR | Status: AC
  Filled 2014-03-18: qty 10

## 2014-03-18 MED ORDER — THROMBIN 20000 UNITS EX SOLR
CUTANEOUS | Status: DC | PRN
  Administered 2014-03-18 (×3): via TOPICAL

## 2014-03-18 MED ORDER — NEOSTIGMINE METHYLSULFATE 10 MG/10ML IV SOLN
INTRAVENOUS | Status: AC
  Filled 2014-03-18: qty 1

## 2014-03-18 MED ORDER — OXYCODONE-ACETAMINOPHEN 5-325 MG PO TABS
ORAL_TABLET | ORAL | Status: AC
  Filled 2014-03-18: qty 2

## 2014-03-18 SURGICAL SUPPLY — 72 items
ADH SKN CLS APL DERMABOND .7 (GAUZE/BANDAGES/DRESSINGS)
APL SKNCLS STERI-STRIP NONHPOA (GAUZE/BANDAGES/DRESSINGS) ×2
BAG DECANTER FOR FLEXI CONT (MISCELLANEOUS) ×5 IMPLANT
BENZOIN TINCTURE PRP APPL 2/3 (GAUZE/BANDAGES/DRESSINGS) ×6 IMPLANT
BLADE 10 SAFETY STRL DISP (BLADE) ×3 IMPLANT
BLADE SURG ROTATE 9660 (MISCELLANEOUS) ×3 IMPLANT
BONE EQUIVA 10CC (Bone Implant) ×4 IMPLANT
BRUSH SCRUB EZ PLAIN DRY (MISCELLANEOUS) ×3 IMPLANT
BUR CUTTER 7.0 ROUND (BURR) ×5 IMPLANT
BUR MATCHSTICK NEURO 3.0 LAGG (BURR) ×3 IMPLANT
CANISTER SUCT 3000ML (MISCELLANEOUS) ×3 IMPLANT
CLOSURE WOUND 1/2 X4 (GAUZE/BANDAGES/DRESSINGS) ×2
CONT SPEC 4OZ CLIKSEAL STRL BL (MISCELLANEOUS) ×6 IMPLANT
COVER BACK TABLE 24X17X13 BIG (DRAPES) IMPLANT
COVER TABLE BACK 60X90 (DRAPES) ×3 IMPLANT
DERMABOND ADVANCED (GAUZE/BANDAGES/DRESSINGS)
DERMABOND ADVANCED .7 DNX12 (GAUZE/BANDAGES/DRESSINGS) IMPLANT
DILATOR NON-RADIOLUCENT CANN (MISCELLANEOUS) ×4 IMPLANT
DRAPE C-ARM 42X72 X-RAY (DRAPES) ×6 IMPLANT
DRAPE C-ARMOR (DRAPES) ×2 IMPLANT
DRAPE LAPAROTOMY 100X72X124 (DRAPES) ×3 IMPLANT
DRAPE SURG 17X23 STRL (DRAPES) ×6 IMPLANT
DRSG OPSITE POSTOP 4X6 (GAUZE/BANDAGES/DRESSINGS) ×3 IMPLANT
DRSG OPSITE POSTOP 4X8 (GAUZE/BANDAGES/DRESSINGS) ×2 IMPLANT
DRSG TELFA 3X8 NADH (GAUZE/BANDAGES/DRESSINGS) ×3 IMPLANT
DURAPREP 26ML APPLICATOR (WOUND CARE) ×3 IMPLANT
ELECT REM PT RETURN 9FT ADLT (ELECTROSURGICAL) ×3
ELECTRODE REM PT RTRN 9FT ADLT (ELECTROSURGICAL) ×1 IMPLANT
EVACUATOR 1/8 PVC DRAIN (DRAIN) ×3 IMPLANT
GAUZE SPONGE 4X4 16PLY XRAY LF (GAUZE/BANDAGES/DRESSINGS) ×4 IMPLANT
GLOVE BIOGEL PI IND STRL 7.5 (GLOVE) IMPLANT
GLOVE BIOGEL PI INDICATOR 7.5 (GLOVE) ×4
GLOVE ECLIPSE 8.0 STRL XLNG CF (GLOVE) ×12 IMPLANT
GLOVE ECLIPSE 8.5 STRL (GLOVE) ×6 IMPLANT
GLOVE SURG SS PI 7.0 STRL IVOR (GLOVE) ×10 IMPLANT
GOWN STRL REUS W/ TWL LRG LVL3 (GOWN DISPOSABLE) IMPLANT
GOWN STRL REUS W/ TWL XL LVL3 (GOWN DISPOSABLE) ×2 IMPLANT
GOWN STRL REUS W/TWL 2XL LVL3 (GOWN DISPOSABLE) IMPLANT
GOWN STRL REUS W/TWL LRG LVL3 (GOWN DISPOSABLE) ×9
GOWN STRL REUS W/TWL XL LVL3 (GOWN DISPOSABLE) ×18
IMPLANT PEEK ARDIS 8 X 8 X 26 (Orthopedic Implant) ×8 IMPLANT
K-WIRE NITHNOL TROCAR TIP (WIRE) ×18 IMPLANT
KIT BASIN OR (CUSTOM PROCEDURE TRAY) ×3 IMPLANT
KIT ROOM TURNOVER OR (KITS) ×3 IMPLANT
NEEDLE HYPO 22GX1.5 SAFETY (NEEDLE) ×3 IMPLANT
NEEDLE TARGETING (NEEDLE) ×16 IMPLANT
NS IRRIG 1000ML POUR BTL (IV SOLUTION) ×3 IMPLANT
PACK LAMINECTOMY NEURO (CUSTOM PROCEDURE TRAY) ×3 IMPLANT
PAD ARMBOARD 7.5X6 YLW CONV (MISCELLANEOUS) ×15 IMPLANT
PAD DRESSING TELFA 3X8 NADH (GAUZE/BANDAGES/DRESSINGS) ×1 IMPLANT
PATTIES SURGICAL .75X.75 (GAUZE/BANDAGES/DRESSINGS) ×5 IMPLANT
PEEK OPTIMA 9X9X26MM (Peek) ×4 IMPLANT
ROD PREBENT PERC 10MM (Rod) ×2 IMPLANT
SCREW MIN INVASIVE 6.5X45 (Screw) ×12 IMPLANT
SCREW POLYAXIA MIS 6.5X40MM (Screw) ×2 IMPLANT
SPONGE GAUZE 4X4 12PLY (GAUZE/BANDAGES/DRESSINGS) ×3 IMPLANT
SPONGE LAP 4X18 X RAY DECT (DISPOSABLE) ×6 IMPLANT
SPONGE SURGIFOAM ABS GEL 100 (HEMOSTASIS) ×3 IMPLANT
STRIP CLOSURE SKIN 1/2X4 (GAUZE/BANDAGES/DRESSINGS) ×4 IMPLANT
SUT PROLENE 0 CT 1 30 (SUTURE) ×3 IMPLANT
SUT VIC AB 0 CT1 18XCR BRD8 (SUTURE) ×1 IMPLANT
SUT VIC AB 0 CT1 8-18 (SUTURE) ×3
SUT VIC AB 2-0 OS6 18 (SUTURE) ×9 IMPLANT
SUT VIC AB 3-0 CP2 18 (SUTURE) ×3 IMPLANT
SYR 20ML ECCENTRIC (SYRINGE) ×3 IMPLANT
TOP CLSR SEQUOIA (Orthopedic Implant) ×18 IMPLANT
TOWEL OR 17X24 6PK STRL BLUE (TOWEL DISPOSABLE) ×3 IMPLANT
TOWEL OR 17X26 10 PK STRL BLUE (TOWEL DISPOSABLE) ×3 IMPLANT
TRAP SPECIMEN MUCOUS 40CC (MISCELLANEOUS) ×2 IMPLANT
TRAY FOLEY CATH 14FRSI W/METER (CATHETERS) ×1 IMPLANT
TRAY FOLEY CATH 16FRSI W/METER (SET/KITS/TRAYS/PACK) ×2 IMPLANT
WATER STERILE IRR 1000ML POUR (IV SOLUTION) ×3 IMPLANT

## 2014-03-18 NOTE — Op Note (Signed)
Preop diagnosis: Spinal stenosis with listhesis and scoliosis L2-3 L3-4 L4-5 with the central and lateral recess stenosis Postop diagnosis: Same Procedure: Bilateral L2-3 L3-4 L4-5 decompressive laminectomy for relief of central and lateral recess stenosis Bilateral L2-3 L3-4 L4-5 microdiscectomy L2-3 L3-4 L4-5 posterior lumbar interbody fusion with peek interbody spacer L2-3 L3-4 L4-5 posterolateral fusion Segmental pedicle screw instrumentation L2-3-4 5 with Pathfinder percutaneous pedicle screw system Surgeon: Nivia Gervase Assistant: Pool  After being placed the prone position the patient's back was prepped and draped in the usual sterile fashion. Midline incision was made above the spinous processes of L2 L3-L4 and L5. Using Bovie cutting current the incision was carried on the spinous processes. The plane between the subcutaneous fat and fascia was dissected free. The fascia was then opened along the spinous processes of L2 L3-L4 and L5. Subperiosteal dissection was carried carried out bilaterally on the spinous processes lamina facet joint and self-retaining tract was placed for exposure. X-ray showed approach the appropriate levels. Spinous processes of L2 L3-L4 and L5 were removed. On the left side, generous laminotomy was performed at L2-3 L3-4 and L4-5. Aggressive medial facetectomy was also performed. We then went to the right side and a similar decompression and remove the residual midline structures to complete the bilateral decompression at L2-3 L3-4 and L4-5. Aggressive lateral recess stenosis decompression was carried out as well as relief of the midline stenosis. We then identified the discs at L2-3 L3-4 and L4-5. Coagulation was carried out on the annulus the annulus was then incised and thoroughly cleaned out of the disc spaces at all 3 levels bilaterally. We then prepared the disc for interbody fusion. We distracted L2-3 to 9 mm size to prepare for interbody fusion with a variety of  instrumentation. We then placed a 9 x 9 x 26 Miller cage at L2-3. Prior to placing the cage on the opposite side we placed a mixture of autologous bone morselized allograft it within the interspace to help with interbody fusion. We did similar interbody fusions at L3-4 and L4-5 except those levels we used 8 x 9 x 26 mm cages. Once again we placed autologous bone morselized allograft it within the interspace to help with the interbody fusions. Cages were placed without difficulty. We irrigated copiously controlled any bleeding with upper coagulation Gelfoam. We closed the midline fascia and then placed percutaneous pedicle screws bilaterally at L2 L3-L4 and L5. We passed the Jamshidi needle and then placed wire through the needle. The cortical surface with the awl and then tapped with a 6.0 mm tap. We placed 6.5 x 45 mm screws bilaterally at L2-3-4 and 5. We did place 16.5 x 40 mm screw at L5. We then passed pre-bent rods through the [facility] and reduce them down into the screw heads with the top loading nuts. We then did tightening and final tightening with torque and counter torque fossae showed good position in AP lateral direction. We then removed the [facility] and irrigated copiously. Final fluoroscopy showed good position of the screws rods interbody spacers. We closed the lateral fascial openings with interrupted Vicryl and closed the rest of the wound with interrupted Vicryl on the subcutaneous subcuticular tissues. Running locking Prolene was placed on the skin. Shortness was then applied the patient was extubated and taken recovery room in stable condition.

## 2014-03-18 NOTE — Transfer of Care (Signed)
Immediate Anesthesia Transfer of Care Note  Patient: Billy Alexander  Procedure(s) Performed: Procedure(s): POSTERIOR LUMBAR FUSION 3 LEVEL LUMBAR TWO/THREE, THREE/FOUR, FOUR/FIVE (N/A)  Patient Location: PACU  Anesthesia Type:General  Level of Consciousness: awake  Airway & Oxygen Therapy: Patient Spontanous Breathing and Patient connected to nasal cannula oxygen  Post-op Assessment: Report given to PACU RN and Post -op Vital signs reviewed and stable  Post vital signs: Reviewed and stable  Complications: No apparent anesthesia complications

## 2014-03-18 NOTE — Addendum Note (Signed)
Addendum created 03/18/14 1617 by Sheppard Plumber, CRNA   Modules edited: Anesthesia Flowsheet

## 2014-03-18 NOTE — Anesthesia Preprocedure Evaluation (Addendum)
Anesthesia Evaluation  Patient identified by MRN, date of birth, ID band Patient awake    Reviewed: Allergy & Precautions, H&P , NPO status , Patient's Chart, lab work & pertinent test results  Airway Mallampati: II TM Distance: >3 FB Neck ROM: Full    Dental  (+) Teeth Intact, Dental Advisory Given   Pulmonary Current Smoker,  breath sounds clear to auscultation        Cardiovascular negative cardio ROS  Rhythm:Regular Rate:Normal     Neuro/Psych    GI/Hepatic negative GI ROS, Neg liver ROS,   Endo/Other  negative endocrine ROS  Renal/GU negative Renal ROS     Musculoskeletal   Abdominal   Peds  Hematology   Anesthesia Other Findings   Reproductive/Obstetrics                        Anesthesia Physical Anesthesia Plan  ASA: II  Anesthesia Plan: General   Post-op Pain Management:    Induction: Intravenous  Airway Management Planned: Oral ETT  Additional Equipment:   Intra-op Plan:   Post-operative Plan: Extubation in OR  Informed Consent: I have reviewed the patients History and Physical, chart, labs and discussed the procedure including the risks, benefits and alternatives for the proposed anesthesia with the patient or authorized representative who has indicated his/her understanding and acceptance.   Dental advisory given  Plan Discussed with: CRNA, Anesthesiologist and Surgeon  Anesthesia Plan Comments:       Anesthesia Quick Evaluation

## 2014-03-18 NOTE — Anesthesia Postprocedure Evaluation (Signed)
  Anesthesia Post-op Note  Patient: Billy Alexander  Procedure(s) Performed: Procedure(s): POSTERIOR LUMBAR FUSION 3 LEVEL LUMBAR TWO/THREE, THREE/FOUR, FOUR/FIVE (N/A)  Patient Location: PACU  Anesthesia Type:General  Level of Consciousness: awake  Airway and Oxygen Therapy: Patient Spontanous Breathing  Post-op Pain: mild  Post-op Assessment: Post-op Vital signs reviewed  Post-op Vital Signs: Reviewed  Last Vitals:  Filed Vitals:   03/18/14 1535  BP: 164/123  Pulse: 51  Temp:   Resp: 9    Complications: No apparent anesthesia complications

## 2014-03-18 NOTE — H&P (Signed)
  Billy Alexander is an 53 y.o. male.   Chief Complaint: Back and leg pain HPI: The patient is a 34 (who is evaluated number of months ago for both neck issues as well as lower back issues. He had an MRI scan of the cervical spine which showed severe stenosis at C3-4 and this was addressed first with an anterior cervical discectomy a few months ago. He did extremely well and was then ready to address his lower back issues. He underwent MRI scan which showed a number of abnormalities including severe stenosis at L2-3 L3-4 and L4-5 with severe degenerative disease. There is marked foraminal and spinal stenosis all 3 levels as well as scoliosis. After discussing the options the patient requested surgery now comes for a two-level posterior lumbar interbody fusion with pedicle screw fixation. I've had a long discussion with him regarding the risks and benefits of surgical intervention. The risks discussed include but are not limited to bleeding infection weakness numbness paralysis trouble with instrumentation nonunion coma and death. We have discussed alternative methods of therapy offered risks and benefits of nonintervention. He's had the opportunity to ask numerous questions and appears to understand. With this information in hand he has requested that we proceed with surgery.  Past Medical History  Diagnosis Date  . 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 Cervical spine surgery  01/2014 and 2007  . Nephrectomy Left 2007  . Eye surgery Bilateral 2011    Lasik    History reviewed. No pertinent family history. Social History:  reports that he has been smoking Cigarettes.  He has been smoking about 1.00 pack per day. He has never used smokeless tobacco. He reports that he does not drink alcohol or use illicit drugs.  Allergies: No Known Allergies  Medications Prior to Admission  Medication Sig Dispense Refill  . Artificial Tear Ointment (ARTIFICIAL TEARS)  ointment Place 1 drop into both eyes as needed (dry eyes).      . carisoprodol (SOMA) 350 MG tablet Take 350 mg by mouth 2 (two) times daily as needed for muscle spasms.      . fluticasone (FLONASE) 50 MCG/ACT nasal spray Place 2 sprays into both nostrils daily as needed for allergies or rhinitis.        No results found for this or any previous visit (from the past 48 hour(s)). No results found.  Negative except for the issues mentioned in history of present illness  Blood pressure 141/89, pulse 65, temperature 97.5 F (36.4 C), temperature source Oral, resp. rate 20, weight 91.173 kg (201 lb), SpO2 98.00%.  The patient is awake alert and oriented. His no facial asymmetry. His gait is mildly antalgic. Reflexes are decreased but equal. His strength and sensation however are intact Assessment/Plan Impression is that of multilevel degenerative disease with scoliosis and stenosis. The plan is for a three-level decompression with interbody fusion and pedicle screw fixation  Faythe Ghee, MD 03/18/2014, 7:32 AM

## 2014-03-18 NOTE — Progress Notes (Signed)
Patient admitted from pacu, alert and oriented x4.

## 2014-03-18 NOTE — Anesthesia Procedure Notes (Signed)
Procedure Name: Intubation Date/Time: 03/18/2014 7:43 AM Performed by: Marinda Elk A Pre-anesthesia Checklist: Patient identified, Timeout performed, Emergency Drugs available, Suction available and Patient being monitored Patient Re-evaluated:Patient Re-evaluated prior to inductionOxygen Delivery Method: Circle system utilized Preoxygenation: Pre-oxygenation with 100% oxygen Intubation Type: IV induction Ventilation: Mask ventilation without difficulty Tube type: Oral Tube size: 7.5 mm Number of attempts: 1 Airway Equipment and Method: Video-laryngoscopy Placement Confirmation: ETT inserted through vocal cords under direct vision,  breath sounds checked- equal and bilateral and positive ETCO2 Secured at: 23 cm Tube secured with: Tape Dental Injury: Teeth and Oropharynx as per pre-operative assessment

## 2014-03-19 MED ORDER — PANTOPRAZOLE SODIUM 40 MG PO TBEC
40.0000 mg | DELAYED_RELEASE_TABLET | Freq: Every day | ORAL | Status: DC
  Administered 2014-03-19 – 2014-03-20 (×2): 40 mg via ORAL
  Filled 2014-03-19 (×2): qty 1

## 2014-03-19 MED ORDER — HYDROMORPHONE HCL PF 1 MG/ML IJ SOLN
2.0000 mg | INTRAMUSCULAR | Status: DC | PRN
  Administered 2014-03-19 – 2014-03-20 (×8): 2 mg via INTRAMUSCULAR
  Filled 2014-03-19 (×8): qty 2

## 2014-03-19 MED ORDER — CARISOPRODOL 350 MG PO TABS
350.0000 mg | ORAL_TABLET | Freq: Three times a day (TID) | ORAL | Status: DC
  Administered 2014-03-19 (×2): 350 mg via ORAL
  Filled 2014-03-19 (×2): qty 1

## 2014-03-19 MED ORDER — OXYCODONE HCL 5 MG PO TABS
15.0000 mg | ORAL_TABLET | ORAL | Status: DC | PRN
  Administered 2014-03-19 – 2014-03-21 (×9): 15 mg via ORAL
  Filled 2014-03-19 (×9): qty 3

## 2014-03-19 MED ORDER — DIAZEPAM 5 MG PO TABS
10.0000 mg | ORAL_TABLET | Freq: Four times a day (QID) | ORAL | Status: DC | PRN
  Administered 2014-03-19 – 2014-03-20 (×3): 10 mg via ORAL
  Filled 2014-03-19 (×3): qty 2

## 2014-03-19 NOTE — Progress Notes (Signed)
Occupational Therapy Evaluation Patient Details Name: Billy Alexander MRN: [id number] DOB: 1960/10/20 Today's Date: 03/19/2014    History of Present Illness Pt is 53 y.o. Male s/p PLIF 3 level on 03/18/14.   Clinical Impression   PTA pt lived at home with his wife and was independent with ADLs and functional mobility. Pt is highly limited by pain and is irritated with staff. Pt declined PT this date, however requested to use bathroom with OT. Pt refuses to accept safe technique for bed mobility and transfers and insists on doing it the way he prefers, leading to safety concerns. Feel that pt will not progress well with therapy until his pain is under control. D/C recommendation pending progress over next day or two.     Follow Up Recommendations  Supervision/Assistance - 24 hour;Other (comment) (TBD based on progress when pain is controlled)    Equipment Recommendations  None recommended by OT       Precautions / Restrictions Precautions Precautions: Back;Fall Precaution Booklet Issued: Yes (comment) Precaution Comments: Briefly educated on 3/3 back precautions. Discussed with wife importance of back precautions in protecting surgical site. Restrictions Weight Bearing Restrictions: No      Mobility Bed Mobility Overal bed mobility: Needs Assistance Bed Mobility: Rolling;Sidelying to Sit;Sit to Sidelying Rolling: Mod assist (pt demanded that therapist roll him using bed pad) Sidelying to sit: Max assist;HOB elevated     Sit to sidelying: Mod assist General bed mobility comments: Pt insisted on having HOB up and use of bedrails despite log roll technique. Became irritated with OT guided pt through log roll technique.   Transfers Overall transfer level: Needs assistance Equipment used: Rolling walker (2 wheeled) Transfers: Sit to/from Stand Sit to Stand: Mod assist;From elevated surface         General transfer comment: VC's for hand placement, sequencing, and safety. Pt  insisted "I can't do it that way" and pulled on RW to stand. Pt required Mod A to power up and therapist to stabilize RW due to unsafe technique. Pt wanted bed elevated to unsafe level but OT declined and stated that pt's feet should be on the floor when he goes to stand from surface.     Balance Overall balance assessment: Needs assistance Sitting-balance support: No upper extremity supported;Feet supported Sitting balance-Leahy Scale: Poor     Standing balance support: Bilateral upper extremity supported;During functional activity Standing balance-Leahy Scale: Poor                              ADL Overall ADL's : Needs assistance/impaired Eating/Feeding: Independent;Sitting   Grooming: Set up;Sitting   Upper Body Bathing: Set up;Sitting   Lower Body Bathing: Moderate assistance;Sit to/from stand;Adhering to back precautions   Upper Body Dressing : Moderate assistance;Sitting   Lower Body Dressing: Maximal assistance;Sit to/from stand;Adhering to back precautions   Toilet Transfer: Moderate assistance;Ambulation;RW   Toileting- Clothing Manipulation and Hygiene: Moderate assistance;Sit to/from stand   Tub/ Shower Transfer: Tub transfer;Total assistance   Functional mobility during ADLs: Minimal assistance;Rolling walker;Cueing for safety;Cueing for sequencing General ADL Comments: Pt in high level of pain and irritated with staff. Pt refusing to accept correct way of moving, including bed mobility and transfers and repeatedly says "I can't do that. If I would I could." Pt yells at staff if they attempt to have him perform movements correctly or change bed positioning. Pt has limited participation in therapy and did not work with PT this  date. Educated briefly on precautions and discussed home layout and importance of precautions with wife. During discussion, pt repeatedly asked for chapstick from his wife and when she ignored him he used call bell to call front desk to  request chapstick. Feel that pt will not progress well until pain is controlled.      Vision  No apparent visual deficits.                    Perception Perception Perception Tested?: No   Praxis Praxis Praxis tested?: Within functional limits    Pertinent Vitals/Pain Pt in high level of pain, however ignores requests to describe or rate pain. Pt makes statements such as "You don't know how bad this hurts" and "I can't do that because it hurts." Repositioned pt in bed for max comfort.         Extremity/Trunk Assessment Upper Extremity Assessment Upper Extremity Assessment: Overall WFL for tasks assessed   Lower Extremity Assessment Lower Extremity Assessment: Defer to PT evaluation       Communication Communication Communication: No difficulties   Cognition Arousal/Alertness: Awake/alert Behavior During Therapy: Agitated Overall Cognitive Status: Within Functional Limits for tasks assessed       Memory: Decreased recall of precautions                        Home Living Family/patient expects to be discharged to:: Private residence Living Arrangements: Spouse/significant other Available Help at Discharge: Family;Available 24 hours/day (wife will be home for 1 week) Type of Home: House Home Access: Ramped entrance     Home Layout: One level     Bathroom Shower/Tub: Tub/shower unit Shower/tub characteristics: Architectural technologist: Standard     Home Equipment: None          Prior Functioning/Environment Level of Independence: Independent             OT Diagnosis: Generalized weakness;Acute pain   OT Problem List: Decreased strength;Decreased range of motion;Decreased activity tolerance;Impaired balance (sitting and/or standing);Decreased safety awareness;Decreased knowledge of use of DME or AE;Decreased knowledge of precautions;Pain   OT Treatment/Interventions: Self-care/ADL training;Therapeutic exercise;Energy conservation;DME  and/or AE instruction;Therapeutic activities;Patient/family education;Balance training    OT Goals(Current goals can be found in the care plan section) Acute Rehab OT Goals Patient Stated Goal: none stated OT Goal Formulation: With patient/family Time For Goal Achievement: 03/26/14 Potential to Achieve Goals: Fair ADL Goals Pt Will Perform Grooming: with modified independence;standing Pt Will Perform Lower Body Bathing: with modified independence;with adaptive equipment;sit to/from stand Pt Will Perform Upper Body Dressing: with set-up;with supervision;sitting Pt Will Perform Lower Body Dressing: with modified independence;with adaptive equipment;sit to/from stand Pt Will Transfer to Toilet: with modified independence;ambulating;regular height toilet Pt Will Perform Toileting - Clothing Manipulation and hygiene: with modified independence;sit to/from stand;with adaptive equipment Additional ADL Goal #1: Pt will perform bed mobility using log roll technique with Supervision to prepare for ADLs.   OT Frequency: Min 2X/week    End of Session Equipment Utilized During Treatment: Gait belt;Rolling walker;Back brace Nurse Communication: Patient requests pain meds  Activity Tolerance: Patient limited by pain Patient left: in bed;with call bell/phone within reach;with bed alarm set;with family/visitor present   Time: 1017-5102 OT Time Calculation (min): 23 min Charges:  OT General Charges $OT Visit: 1 Procedure OT Evaluation $Initial OT Evaluation Tier I: 1 Procedure OT Treatments $Self Care/Home Management : 8-22 mins  Juluis Rainier [phone] 03/19/2014, 4:21 PM

## 2014-03-19 NOTE — Progress Notes (Signed)
PT Cancellation Note  Patient Details Name: Billy Alexander MRN: [id number] DOB: 01/19/1961   Cancelled Treatment:    Reason Eval/Treat Not Completed: Patient declined, no reason specified  Second attempt to evaluate patient today. States his pain has improved but does not want to work with therapy today. States he "may" try tomorrow. Will follow up tomorrow.  [place], [place]    Candie Mile S 03/19/2014, 12:00 PM

## 2014-03-19 NOTE — Progress Notes (Signed)
PT Cancellation Note  Patient Details Name: Billy Alexander MRN: [id number] DOB: [birth year]   Cancelled Treatment:    Reason Eval/Treat Not Completed: Pain limiting ability to participate  Attempted evaluation this AM. Pt states he is unable to work with physical therapy until his pain is better controlled. Nurse notified - states he due for additional pain medication soon but cannot give until scheduled time. Will follow up with pt again today.  [place], [place]   Candie Mile S 03/19/2014, 7:50 AM

## 2014-03-19 NOTE — Progress Notes (Signed)
Patient ID: Billy Alexander, male   DOB: 11-Apr-1961, 53 y.o.   MRN: [id number] POD 1 Afeb, vss No new neuro issues C/o a lot of incisional pain but says legs a lot better. Will increase activity as tolerated.

## 2014-03-20 LAB — GLUCOSE, CAPILLARY: Glucose-Capillary: 116 mg/dL — ABNORMAL HIGH (ref 70–99)

## 2014-03-20 MED ORDER — POLYETHYLENE GLYCOL 3350 17 G PO PACK
17.0000 g | PACK | Freq: Every day | ORAL | Status: DC | PRN
  Administered 2014-03-20: 17 g via ORAL
  Filled 2014-03-20 (×2): qty 1

## 2014-03-20 MED ORDER — SENNOSIDES-DOCUSATE SODIUM 8.6-50 MG PO TABS
1.0000 | ORAL_TABLET | Freq: Two times a day (BID) | ORAL | Status: DC
  Administered 2014-03-20 – 2014-03-21 (×3): 1 via ORAL
  Filled 2014-03-20 (×2): qty 1

## 2014-03-20 MED ORDER — KETOROLAC TROMETHAMINE 30 MG/ML IJ SOLN
30.0000 mg | Freq: Four times a day (QID) | INTRAMUSCULAR | Status: DC
  Administered 2014-03-20 – 2014-03-21 (×4): 30 mg via INTRAVENOUS
  Filled 2014-03-20 (×8): qty 1

## 2014-03-20 MED ORDER — DIAZEPAM 5 MG PO TABS
5.0000 mg | ORAL_TABLET | Freq: Four times a day (QID) | ORAL | Status: DC | PRN
  Administered 2014-03-20 – 2014-03-21 (×2): 5 mg via ORAL
  Filled 2014-03-20 (×2): qty 1

## 2014-03-20 NOTE — Progress Notes (Signed)
Sign and held orders for PCA dilaudid discontinued with Dr. Sande Rives permission; patient's pain relieved by Toradol thus far; declines PCA.

## 2014-03-20 NOTE — Progress Notes (Signed)
Utilization Review Completed.Billy Alexander T6/14/2015  

## 2014-03-20 NOTE — Progress Notes (Signed)
Hemovac tubing got pulled out accidentally from patient's incision around 2330. No increased drainage noted around the incision. No sign of swelling or inflammation noted. Last amount of drainage was 30 mL. Will continue to monitor closely.

## 2014-03-20 NOTE — Evaluation (Signed)
Physical Therapy Evaluation Patient Details Name: Billy Alexander MRN: [id number] DOB: [birth year] Today's Date: 03/20/2014   History of Present Illness  Pt is 53 y.o. Male s/p PLIF 3 level on 03/18/14.  Clinical Impression  Pt admitted with multilevel back surgery and now with poorly controlled pain which is affecting his ability to mobilize as well as follow instructions and participate with therapy. Pt ambulated 25' with RW and min A, though requiring mod A and sometimes +2 for bed mobility and transfers. Pt currently with functional limitations due to the deficits listed below (see PT Problem List).  Pt will benefit from skilled PT to increase their independence and safety with mobility to allow discharge to the venue listed below. PT will continue to follow.       Follow Up Recommendations Home health PT;Supervision for mobility/OOB    Equipment Recommendations  Rolling walker with 5" wheels    Recommendations for Other Services       Precautions / Restrictions Precautions Precautions: Back;Fall Precaution Booklet Issued: Yes (comment) Precaution Comments: reviewed 3/3 back precautions and reinforced with mobility Restrictions Weight Bearing Restrictions: No      Mobility  Bed Mobility Overal bed mobility: Needs Assistance Bed Mobility: Rolling;Sit to Supine Rolling: Min assist     Sit to supine: Mod assist   General bed mobility comments: pt dependent for legs into bed and required mod A lowering trunk down to bed. Pracautions reinforced during mobility, pt very annoyed at this   Transfers Overall transfer level: Needs assistance Equipment used: Rolling walker (2 wheeled) Transfers: Sit to/from Stand Sit to Stand: +2 physical assistance;Mod assist         General transfer comment: pt could not slide hips fwd to edge of chair and would not allow self to be slid fwd so required mod A to stand, PT on one side, RN on other. Pushed up to crouched position and then  assisted in wt-shifting fwd. Same issue with stand to sit, would not shift wt back so sat at edge of bed and then assisted in scooting back.  Ambulation/Gait Ambulation/Gait assistance: Min assist Ambulation Distance (Feet): 25 Feet Assistive device: Rolling walker (2 wheeled) Gait Pattern/deviations: Step-to pattern;Decreased stance time - right;Decreased weight shift to right Gait velocity: decreased Gait velocity interpretation: Below normal speed for age/gender General Gait Details: pt avoided full wt-bearing on RLE and right foot in somewhat inverted position in wt-bearing. Pt ambulated 25' only at encouragement of family. PT continued to reinforce benefits of mobility in healing and mgmt of pain.  Stairs            Wheelchair Mobility    Modified Rankin (Stroke Patients Only)       Balance Overall balance assessment: Needs assistance Sitting-balance support: Bilateral upper extremity supported;Feet supported Sitting balance-Leahy Scale: Poor   Postural control: Posterior lean Standing balance support: Bilateral upper extremity supported;During functional activity Standing balance-Leahy Scale: Poor Standing balance comment: largely due to pain                             Pertinent Vitals/Pain 10/10 back pain, premedicated, positioned for comfort in bed after session.    Home Living Family/patient expects to be discharged to:: Private residence Living Arrangements: Spouse/significant other Available Help at Discharge: Family;Available 24 hours/day Type of Home: House Home Access: Ramped entrance     Home Layout: One level Home Equipment: None Additional Comments: wife will be home with pt  for 1 week    Prior Function Level of Independence: Independent               Hand Dominance        Extremity/Trunk Assessment   Upper Extremity Assessment: Defer to OT evaluation;Overall WFL for tasks assessed           Lower Extremity  Assessment: RLE deficits/detail;LLE deficits/detail RLE Deficits / Details: RLE more painful than left. No MMT due to pain but functionally, keeps wt off right side with ambulation, no noted buckling of knee or trendelenberg of hip though LLE Deficits / Details: painful but less than right, appears to be [facility] strength but unable to MMT due to pain  Cervical / Trunk Assessment: Normal  Communication   Communication: No difficulties  Cognition Arousal/Alertness: Awake/alert Behavior During Therapy: Flat affect;Agitated Overall Cognitive Status: Impaired/Different from baseline Area of Impairment: Following commands;Problem solving     Memory: Decreased recall of precautions Following Commands: Follows one step commands inconsistently     Problem Solving: Slow processing;Difficulty sequencing;Requires verbal cues General Comments: pt seems nearly delerious with pain, misunderstanding questions and slightly confused, difficulty processing instructions. RN reports he has been this way with her as well as well as being very agitated and angry    General Comments      Exercises        Assessment/Plan    PT Assessment Patient needs continued PT services  PT Diagnosis Difficulty walking;Abnormality of gait;Acute pain   PT Problem List Decreased activity tolerance;Decreased balance;Decreased mobility;Decreased knowledge of use of DME;Decreased safety awareness;Decreased knowledge of precautions;Pain  PT Treatment Interventions DME instruction;Gait training;Functional mobility training;Therapeutic activities;Therapeutic exercise;Balance training;Patient/family education   PT Goals (Current goals can be found in the Care Plan section) Acute Rehab PT Goals Patient Stated Goal: none stated PT Goal Formulation: With patient Time For Goal Achievement: 03/27/14 Potential to Achieve Goals: Good    Frequency Min 5X/week   Barriers to discharge Decreased caregiver support wife home only 1  week    Co-evaluation               End of Session Equipment Utilized During Treatment: Gait belt;Back brace Activity Tolerance: Patient limited by pain Patient left: in bed;with bed alarm set;with call bell/phone within reach;with family/visitor present Nurse Communication: Mobility status         Time: 2633-3545 PT Time Calculation (min): 23 min   Charges:   PT Evaluation $Initial PT Evaluation Tier I: 1 Procedure PT Treatments $Therapeutic Activity: 8-22 mins   PT G Codes:        Leighton Roach, PT  Acute Rehab Services  [place], [place] 03/20/2014, 11:42 AM

## 2014-03-20 NOTE — Progress Notes (Signed)
Patient with intense back pain and spasms. Denies lower extremity pain. Feels miserable.  Afebrile. Vital stable. Urine output good. Drain output remains high.  Appears very uncomfortable. Moving around slowly. Motor and sensory function extremities normal. Dressing clean and dry. Drain output bloody.  Significant postoperative pain and spasm. We'll change some of his medications to hopefully improve pain control. Continue efforts at mobilization.

## 2014-03-21 MED ORDER — OXYCODONE HCL 15 MG PO TABS: 15.0000 mg | ORAL_TABLET | ORAL | Status: DC | PRN

## 2014-03-21 MED ORDER — CARISOPRODOL 350 MG PO TABS: 350.0000 mg | ORAL_TABLET | Freq: Four times a day (QID) | ORAL | Status: DC | PRN

## 2014-03-21 NOTE — Discharge Summary (Signed)
  Physician Discharge Summary  Patient ID: Billy Alexander MRN: [id number] DOB/AGE: 1961/09/10 53 y.o.  Admit date: 03/18/2014 Discharge date: 03/21/2014  Admission Diagnoses:  Discharge Diagnoses:  Active Problems:   Scoliosis of lumbar spine   Discharged Condition: good  Hospital Course: Surgery 3 days ago with multi level plif. Did well. Good relief of leg pain. Wound healed well. Ambulated fine. Moderate back pain post op, but slowly resolved. By pod 3, ambulating well. Pain adequately controlled. Home with specific instructions given.  Consults: None  Significant Diagnostic Studies: none  Treatments: surgery: L 23 L 34 L 45 plif with screw fixation  Discharge Exam: Blood pressure 130/78, pulse 98, temperature 98.6 F (37 C), temperature source Oral, resp. rate 18, height 5\' 10"  (1.778 m), weight 96.571 kg (212 lb 14.4 oz), SpO2 95.00%. Incision/Wound:clean and dry; no new neuro issues  Disposition:      Medication List    ASK your doctor about these medications       artificial tears ointment  Place 1 drop into both eyes as needed (dry eyes).     carisoprodol 350 MG tablet  Commonly known as:  SOMA  Take 350 mg by mouth 2 (two) times daily as needed for muscle spasms.     fluticasone 50 MCG/ACT nasal spray  Commonly known as:  FLONASE  Place 2 sprays into both nostrils daily as needed for allergies or rhinitis.         At home rest most of the time. Get up 9 or 10 times each day and take a 15 or 20 minute walk. No riding in the car and to your first postoperative appointment. If you have neck surgery you may shower from the chest down starting on the third postoperative day. If you had back surgery he may start showering on the third postoperative day with saran wrap wrapped around your incisional area 3 times. After the shower remove the saran wrap. Take pain medicine as needed and other medications as instructed. Call my office for an  appointment.  SignedFaythe Ghee, MD 03/21/2014, 8:00 AM

## 2014-03-21 NOTE — Progress Notes (Signed)
Pt A&O x4; pt discharge education and instructions completed with pt and spouse at bedside. Both voices understanding and denies any questions. All lines including IV removed from pt. Pt given his prescription order for soma and oxycodone. Pt home equipments delivered to pt in room. Pt ambulated off unit along with spouse and belongings. Pt discharge home and spouse to transport him to disposition. Francis Gaines Parys Elenbaas RN.

## 2014-03-21 NOTE — Progress Notes (Signed)
Physical Therapy Treatment Patient Details Name: Billy Alexander MRN: [id number] DOB: [birth year] Today's Date: 03/21/2014    History of Present Illness Pt is 53 y.o. Male s/p PLIF 3 level on 03/18/14.    PT Comments    Pt anxious and wanting to be d/c, reluctant to participate in PT. Addressed wife's concerns regarding precautions and getting in/out of bed of higher height. Pt safe to d/c home with RW and assist of wife.  Follow Up Recommendations  Home health PT;Supervision for mobility/OOB     Equipment Recommendations  Rolling walker with 5" wheels    Recommendations for Other Services       Precautions / Restrictions Precautions Precautions: Back;Fall Precaution Booklet Issued: Yes (comment) Precaution Comments: Reviewed precautions (pt irritated but able to recall precautions but question compliance Required Braces or Orthoses: Spinal Brace Spinal Brace: Applied in sitting position Restrictions Weight Bearing Restrictions: No    Mobility  Bed Mobility Overal bed mobility: Modified Independent Bed Mobility: Sidelying to Sit;Sit to Sidelying   Sidelying to sit: Modified independent (Device/Increase time) (with bed rail)     Sit to sidelying: Modified independent (Device/Increase time) (with bed rail) General bed mobility comments: discussed using a step stool to assist with getting into bed. Pt able to get into bed with minimal twisting without step stool however pt refused to try step stool due to irritation regarding d/c.  Transfers Overall transfer level: Modified independent Equipment used: Rolling walker (2 wheeled) Transfers: Sit to/from Stand Sit to Stand: Modified independent (Device/Increase time)         General transfer comment: v/c's for hand placementy  Ambulation/Gait Ambulation/Gait assistance: Modified independent (Device/Increase time) Ambulation Distance (Feet): 50 Feet Assistive device: Rolling walker (2 wheeled) Gait Pattern/deviations:  Step-through pattern Gait velocity: decreased Gait velocity interpretation: Below normal speed for age/gender General Gait Details: guarded, good walker use   Stairs            Wheelchair Mobility    Modified Rankin (Stroke Patients Only)       Balance                                    Cognition Arousal/Alertness: Awake/alert Behavior During Therapy:  (irritated b/c he want to go home and have cigarette) Overall Cognitive Status: Impaired/Different from baseline Area of Impairment: Safety/judgement;Following commands       Following Commands: Follows one step commands inconsistently Safety/Judgement: Decreased awareness of safety;Decreased awareness of deficits   Problem Solving: Slow processing General Comments: pt recalls precautions but inconsistent compliance    Exercises      General Comments        Pertinent Vitals/Pain Pt reports "i'm fine"    Home Living                      Prior Function            PT Goals (current goals can now be found in the care plan section) Acute Rehab PT Goals Patient Stated Goal: go home Progress towards PT goals: Progressing toward goals    Frequency  Min 2X/week    PT Plan Frequency needs to be updated    Co-evaluation             End of Session Equipment Utilized During Treatment: Gait belt;Back brace Activity Tolerance: Treatment limited secondary to agitation Patient left:  (sitting in hallway with wife)  Time: 9371-6967 PT Time Calculation (min): 9 min  Charges:  $Gait Training: 8-22 mins                    G Codes:      Billy Alexander 03/21/2014, 11:25 AM Billy Alexander, PT, DPT Pager #: [phone] Office #: [phone]

## 2014-03-21 NOTE — Progress Notes (Signed)
Occupational Therapy Treatment Patient Details Name: Billy Alexander MRN: [id number] DOB: 1961-01-17 Today's Date: 03/21/2014    History of present illness Pt is 53 y.o. Male s/p PLIF 3 level on 03/18/14.   OT comments  Pt agitated during session. Wife present for education during session. Recommending HHOT for d/c.  Follow Up Recommendations  Home health OT;Supervision/Assistance - 24 hour    Equipment Recommendations  None recommended by OT    Recommendations for Other Services      Precautions / Restrictions Precautions Precautions: Back;Fall Precaution Booklet Issued: Yes (comment) Precaution Comments: Reviewed precautions Required Braces or Orthoses: Spinal Brace Restrictions Weight Bearing Restrictions: No       Mobility Bed Mobility               General bed mobility comments: pt refused to practice.  Transfers Overall transfer level: Needs assistance Equipment used: Rolling walker (2 wheeled) Transfers: Sit to/from Stand Sit to Stand: Min guard         General transfer comment: Cues for technique.    Balance                                   ADL Overall ADL's : Needs assistance/impaired     Grooming: Wash/dry face;Min guard;Standing           Upper Body Dressing : Supervision/safety;Standing (adjusted back brace standing)       Toilet Transfer: Min guard;Ambulation;Comfort height toilet;RW;Grab bars           Functional mobility during ADLs: Min guard;Rolling walker General ADL Comments: Educated on safe shoewear and use of bag on walker. Educated on use of cup for teeth care and placement of grooming items. Pt not very receptive during session. Pt able to cross legs to simulate LB dressing-OT explained he could get dressed this way if maintaining precautions. Spoke with wife about how pt could get dressed in supine or use AE-showed her kit. Also, educated on toilet aid for hygiene if needed. Discussed to have clothing  under brace.       Vision                     Perception     Praxis      Cognition   Behavior During Therapy: Flat affect;Agitated Overall Cognitive Status: Impaired/Different from baseline Area of Impairment: Safety/judgement;Following commands        Following Commands: Follows one step commands inconsistently Safety/Judgement: Decreased awareness of safety;Decreased awareness of deficits   Problem Solving: Slow processing      Extremity/Trunk Assessment               Exercises     Shoulder Instructions       General Comments      Pertinent Vitals/ Pain       Pain 8/10. Pillow positioned behind back during session.  Home Living                                          Prior Functioning/Environment              Frequency Min 2X/week     Progress Toward Goals  OT Goals(current goals can now be found in the care plan section)  Progress towards OT goals: Progressing toward goals  Acute Rehab OT  Goals Patient Stated Goal: go home OT Goal Formulation: With patient/family Time For Goal Achievement: 03/26/14 Potential to Achieve Goals: Fair ADL Goals Pt Will Perform Grooming: with modified independence;standing Pt Will Perform Lower Body Bathing: with modified independence;with adaptive equipment;sit to/from stand Pt Will Perform Upper Body Dressing: with set-up;with supervision;sitting Pt Will Perform Lower Body Dressing: with modified independence;with adaptive equipment;sit to/from stand Pt Will Transfer to Toilet: with modified independence;ambulating;regular height toilet Pt Will Perform Toileting - Clothing Manipulation and hygiene: with modified independence;sit to/from stand;with adaptive equipment Additional ADL Goal #1: Pt will perform bed mobility using log roll technique with Supervision to prepare for ADLs.   Plan Discharge plan needs to be updated    Co-evaluation                 End of Session  Equipment Utilized During Treatment: Gait belt;Rolling walker;Back brace   Activity Tolerance Patient limited by pain   Patient Left in chair;with call bell/phone within reach;with chair alarm set;with family/visitor present   Nurse Communication Other (comment) (recommending HHOT)        Time: 4268-3419 OT Time Calculation (min): 18 min  Charges: OT General Charges $OT Visit: 1 Procedure OT Treatments $Self Care/Home Management : 8-22 mins   Benito Mccreedy OTR/L [phone] 03/21/2014, 10:14 AM

## 2014-03-24 ENCOUNTER — Encounter (HOSPITAL_COMMUNITY): Payer: Self-pay | Admitting: Emergency Medicine

## 2014-03-24 ENCOUNTER — Emergency Department (HOSPITAL_COMMUNITY)
Admission: EM | Admit: 2014-03-24 | Discharge: 2014-03-24 | Discharge status: Against Medical Advice | Payer: 59 | Attending: Emergency Medicine | Admitting: Emergency Medicine

## 2014-03-24 DIAGNOSIS — R509 Fever, unspecified: Secondary | ICD-10-CM | POA: Insufficient documentation

## 2014-03-24 DIAGNOSIS — R197 Diarrhea, unspecified: Secondary | ICD-10-CM | POA: Insufficient documentation

## 2014-03-24 DIAGNOSIS — R110 Nausea: Secondary | ICD-10-CM | POA: Insufficient documentation

## 2014-03-24 LAB — COMPREHENSIVE METABOLIC PANEL
ALT: 19 U/L (ref 0–53)
AST: 28 U/L (ref 0–37)
Albumin: 2.8 g/dL — ABNORMAL LOW (ref 3.5–5.2)
Alkaline Phosphatase: 83 U/L (ref 39–117)
BILIRUBIN TOTAL: 0.3 mg/dL (ref 0.3–1.2)
BUN: 14 mg/dL (ref 6–23)
CALCIUM: 9.4 mg/dL (ref 8.4–10.5)
CO2: 24 meq/L (ref 19–32)
Chloride: 100 mEq/L (ref 96–112)
Creatinine, Ser: 0.85 mg/dL (ref 0.50–1.35)
GLUCOSE: 109 mg/dL — AB (ref 70–99)
Potassium: 3.4 mEq/L — ABNORMAL LOW (ref 3.7–5.3)
Sodium: 139 mEq/L (ref 137–147)
Total Protein: 6.7 g/dL (ref 6.0–8.3)

## 2014-03-24 LAB — CBC WITH DIFFERENTIAL/PLATELET
Basophils Absolute: 0 10*3/uL (ref 0.0–0.1)
Basophils Relative: 0 % (ref 0–1)
EOS PCT: 1 % (ref 0–5)
Eosinophils Absolute: 0.1 10*3/uL (ref 0.0–0.7)
HEMATOCRIT: 29.2 % — AB (ref 39.0–52.0)
Hemoglobin: 10.2 g/dL — ABNORMAL LOW (ref 13.0–17.0)
LYMPHS ABS: 0.9 10*3/uL (ref 0.7–4.0)
Lymphocytes Relative: 8 % — ABNORMAL LOW (ref 12–46)
MCH: 31.6 pg (ref 26.0–34.0)
MCHC: 34.9 g/dL (ref 30.0–36.0)
MCV: 90.4 fL (ref 78.0–100.0)
MONO ABS: 0.6 10*3/uL (ref 0.1–1.0)
Monocytes Relative: 6 % (ref 3–12)
NEUTROS ABS: 8.8 10*3/uL — AB (ref 1.7–7.7)
Neutrophils Relative %: 85 % — ABNORMAL HIGH (ref 43–77)
Platelets: 316 10*3/uL (ref 150–400)
RBC: 3.23 MIL/uL — ABNORMAL LOW (ref 4.22–5.81)
RDW: 13.5 % (ref 11.5–15.5)
WBC: 10.4 10*3/uL (ref 4.0–10.5)

## 2014-03-24 NOTE — ED Notes (Signed)
Pt approached nurse first desk and told NS that he was leaving. Nurse secretary attempted to try to talk patient into staying, patient declined and turned in pager to registration.

## 2014-03-24 NOTE — ED Notes (Signed)
Pt reports having lumbar surgery on Friday and now having fever, chills, diarrhea and nausea.

## 2014-07-12 ENCOUNTER — Ambulatory Visit (INDEPENDENT_AMBULATORY_CARE_PROVIDER_SITE_OTHER): Payer: 59 | Admitting: Family Medicine

## 2014-07-12 VITALS — BP 128/78 | HR 81 | Temp 98.4°F | Resp 15 | Ht 71.0 in | Wt 200.0 lb

## 2014-07-12 DIAGNOSIS — Z91048 Other nonmedicinal substance allergy status: Secondary | ICD-10-CM

## 2014-07-12 DIAGNOSIS — F17200 Nicotine dependence, unspecified, uncomplicated: Secondary | ICD-10-CM

## 2014-07-12 DIAGNOSIS — Z9109 Other allergy status, other than to drugs and biological substances: Secondary | ICD-10-CM

## 2014-07-12 DIAGNOSIS — R0981 Nasal congestion: Secondary | ICD-10-CM

## 2014-07-12 DIAGNOSIS — J019 Acute sinusitis, unspecified: Secondary | ICD-10-CM

## 2014-07-12 DIAGNOSIS — Z720 Tobacco use: Secondary | ICD-10-CM

## 2014-07-12 MED ORDER — OXYMETAZOLINE HCL 0.05 % NA SOLN: 1.0000 | Freq: Two times a day (BID) | NASAL | Status: DC

## 2014-07-12 MED ORDER — AMOXICILLIN 875 MG PO TABS: ORAL_TABLET | ORAL | Status: DC

## 2014-07-12 NOTE — Progress Notes (Signed)
Patient independently examined and discussed with Philis Fendt. Agree with assessment and plan of care per his note. We did discuss likely viral or allergic nature at this point and risks of recurrent steroid use and antibitoic indications as below.  RTC precautions.

## 2014-07-12 NOTE — Progress Notes (Signed)
     IDENTIFYING INFORMATION  Billy Alexander / male / 04/15/1961 / 53 y.o. / MRN: [id number]  SUBJECTIVE  Chief Complaint: had a chief complaint of Nasal Congestion and additional complaints of Otalgia and Cough.  History of present illness:  Started sneezing and mild cough on Friday.  Since that time he has started having pressure in the right ear, and reports that "if I do not catch it early I will get bronchitis."  He denies fever, chills, shakes, and denies purulent drainage.  He has a history of allergies, but is not taking his prescribed Flonase or any allergy medications.  He says this happens every year at least twice per year, and thinks that it may have something to do with his smoking.  He insist of receiving an antibiotic along with a shot of steroids.    He is a 25 pack year history smoker with no intention of quitting.   The problem list, allergies, medications, family, surgical, and social history were reviewed by me and exist elsewhere in the encounter.    Review of Systems  Constitutional: Negative for fever, chills, weight loss, malaise/fatigue and diaphoresis.  HENT: Negative for congestion, ear discharge, ear pain, hearing loss and sore throat.   Eyes: Negative.   Respiratory: Negative.  Negative for stridor.   Cardiovascular: Negative.   Gastrointestinal: Positive for diarrhea.  Musculoskeletal: Negative.   Skin: Negative for itching and rash.  Neurological: Negative.  Negative for weakness and headaches.  Endo/Heme/Allergies: Negative.     OBJECTIVE  Blood pressure 128/78, pulse 81, temperature 98.4 F (36.9 C), temperature source Oral, resp. rate 15, height 5\' 11"  (1.803 m), weight 200 lb (90.719 kg), SpO2 96.00%.  Physical Exam  Constitutional: He is oriented to person, place, and time and well-developed, well-nourished, and in no distress. No distress.  HENT:  Head: Normocephalic.  Eyes: Conjunctivae and EOM are normal. Pupils are equal, round, and  reactive to light.  Neck: Normal range of motion. Neck supple. No JVD present. No tracheal deviation present. No thyromegaly present.  Cardiovascular: Normal rate, regular rhythm and normal heart sounds.   Pulmonary/Chest: Effort normal and breath sounds normal. No stridor.  Abdominal: Soft. Bowel sounds are normal.  Musculoskeletal: Normal range of motion.  Lymphadenopathy:    He has no cervical adenopathy.  Neurological: He is alert and oriented to person, place, and time.  Skin: Skin is warm and dry. He is not diaphoretic.  Psychiatric: Mood, memory, affect and judgment normal.    ASSESSMENT & PLAN  Acute rhinosinusitis - Plan: amoxicillin (AMOXIL) 875 MG tablet.  Patient educated extensively on the risks associated with antibiotic overuse, and was explicitly told to not start the antibiotic until 10 days after the onset of illness, to which he agreed.      Sinus congestion - Plan: oxymetazoline (AFRIN NASAL SPRAY) 0.05 % nasal spray.  Instructed to to use for three days only.    Environmental allergies: Patient agreed to start his Flonase today, and to continue taking this medication daily.    Smoker: Patient advised to quit smoking today. Also advised that the vast majority of bronchitis is caused by virus.  The risk of COPD was discussed, however patient was not receptive to this.    The patient was instructed to to call or comeback to clinic as needed, or should symptoms warrant.  Philis Fendt, MS, PA-C [facility] 07/12/2014 9:08 AM

## 2014-07-12 NOTE — Patient Instructions (Signed)
Chronic Obstructive Pulmonary Disease Chronic obstructive pulmonary disease (COPD) is a common lung condition in which airflow from the lungs is limited. COPD is a general term that can be used to describe many different lung problems that limit airflow, including both chronic bronchitis and emphysema. If you have COPD, your lung function will probably never return to normal, but there are measures you can take to improve lung function and make yourself feel better.  CAUSES   Smoking (common).   Exposure to secondhand smoke.   Genetic problems.  Chronic inflammatory lung diseases or recurrent infections. SYMPTOMS   Shortness of breath, especially with physical activity.   Deep, persistent (chronic) cough with a large amount of thick mucus.   Wheezing.   Rapid breaths (tachypnea).   Gray or bluish discoloration (cyanosis) of the skin, especially in fingers, toes, or lips.   Fatigue.   Weight loss.   Frequent infections or episodes when breathing symptoms become much worse (exacerbations).   Chest tightness. DIAGNOSIS  Your health care provider will take a medical history and perform a physical examination to make the initial diagnosis. Additional tests for COPD may include:   Lung (pulmonary) function tests.  Chest X-ray.  CT scan.  Blood tests. TREATMENT  Treatment available to help you feel better when you have COPD includes:   Inhaler and nebulizer medicines. These help manage the symptoms of COPD and make your breathing more comfortable.  Supplemental oxygen. Supplemental oxygen is only helpful if you have a low oxygen level in your blood.   Exercise and physical activity. These are beneficial for nearly all people with COPD. Some people may also benefit from a pulmonary rehabilitation program. HOME CARE INSTRUCTIONS   Take all medicines (inhaled or pills) as directed by your health care provider.  Avoid over-the-counter medicines or cough syrups  that dry up your airway (such as antihistamines) and slow down the elimination of secretions unless instructed otherwise by your health care provider.   If you are a smoker, the most important thing that you can do is stop smoking. Continuing to smoke will cause further lung damage and breathing trouble. Ask your health care provider for help with quitting smoking. He or she can direct you to community resources or hospitals that provide support.  Avoid exposure to irritants such as smoke, chemicals, and fumes that aggravate your breathing.  Use oxygen therapy and pulmonary rehabilitation if directed by your health care provider. If you require home oxygen therapy, ask your health care provider whether you should purchase a pulse oximeter to measure your oxygen level at home.   Avoid contact with individuals who have a contagious illness.  Avoid extreme temperature and humidity changes.  Eat healthy foods. Eating smaller, more frequent meals and resting before meals may help you maintain your strength.  Stay active, but balance activity with periods of rest. Exercise and physical activity will help you maintain your ability to do things you want to do.  Preventing infection and hospitalization is very important when you have COPD. Make sure to receive all the vaccines your health care provider recommends, especially the pneumococcal and influenza vaccines. Ask your health care provider whether you need a pneumonia vaccine.  Learn and use relaxation techniques to manage stress.  Learn and use controlled breathing techniques as directed by your health care provider. Controlled breathing techniques include:   Pursed lip breathing. Start by breathing in (inhaling) through your nose for 1 second. Then, purse your lips as if you were  going to whistle and breathe out (exhale) through the pursed lips for 2 seconds.   Diaphragmatic breathing. Start by putting one hand on your abdomen just above  your waist. Inhale slowly through your nose. The hand on your abdomen should move out. Then purse your lips and exhale slowly. You should be able to feel the hand on your abdomen moving in as you exhale.   Learn and use controlled coughing to clear mucus from your lungs. Controlled coughing is a series of short, progressive coughs. The steps of controlled coughing are:  1. Lean your head slightly forward.  2. Breathe in deeply using diaphragmatic breathing.  3. Try to hold your breath for 3 seconds.  4. Keep your mouth slightly open while coughing twice.  5. Spit any mucus out into a tissue.  6. Rest and repeat the steps once or twice as needed. SEEK MEDICAL CARE IF:   You are coughing up more mucus than usual.   There is a change in the color or thickness of your mucus.   Your breathing is more labored than usual.   Your breathing is faster than usual.  SEEK IMMEDIATE MEDICAL CARE IF:   You have shortness of breath while you are resting.   You have shortness of breath that prevents you from:  Being able to talk.   Performing your usual physical activities.   You have chest pain lasting longer than 5 minutes.   Your skin color is more cyanotic than usual.  You measure low oxygen saturations for longer than 5 minutes with a pulse oximeter. MAKE SURE YOU:   Understand these instructions.  Will watch your condition.  Will get help right away if you are not doing well or get worse. Document Released: 07/03/2005 Document Revised: 02/07/2014 Document Reviewed: 05/20/2013 [facility] Patient Information 2015 [place], [place]. This information is not intended to replace advice given to you by your health care provider. Make sure you discuss any questions you have with your health care provider. Allergies Allergies may happen from anything your body is sensitive to. This may be food, medicines, pollens, chemicals, and nearly anything around you in everyday life that  produces allergens. An allergen is anything that causes an allergy producing substance. Heredity is often a factor in causing these problems. This means you may have some of the same allergies as your parents. Food allergies happen in all age groups. Food allergies are some of the most severe and life threatening. Some common food allergies are cow's milk, seafood, eggs, nuts, wheat, and soybeans. SYMPTOMS   Swelling around the mouth.  An itchy red rash or hives.  Vomiting or diarrhea.  Difficulty breathing. SEVERE ALLERGIC REACTIONS ARE LIFE-THREATENING. This reaction is called anaphylaxis. It can cause the mouth and throat to swell and cause difficulty with breathing and swallowing. In severe reactions only a trace amount of food (for example, peanut oil in a salad) may cause death within seconds. Seasonal allergies occur in all age groups. These are seasonal because they usually occur during the same season every year. They may be a reaction to molds, grass pollens, or tree pollens. Other causes of problems are house dust mite allergens, pet dander, and mold spores. The symptoms often consist of nasal congestion, a runny itchy nose associated with sneezing, and tearing itchy eyes. There is often an associated itching of the mouth and ears. The problems happen when you come in contact with pollens and other allergens. Allergens are the particles in the air  that the body reacts to with an allergic reaction. This causes you to release allergic antibodies. Through a chain of events, these eventually cause you to release histamine into the blood stream. Although it is meant to be protective to the body, it is this release that causes your discomfort. This is why you were given anti-histamines to feel better. If you are unable to pinpoint the offending allergen, it may be determined by skin or blood testing. Allergies cannot be cured but can be controlled with medicine. Hay fever is a collection of all  or some of the seasonal allergy problems. It may often be treated with simple over-the-counter medicine such as diphenhydramine. Take medicine as directed. Do not drink alcohol or drive while taking this medicine. Check with your caregiver or package insert for child dosages. If these medicines are not effective, there are many new medicines your caregiver can prescribe. Stronger medicine such as nasal spray, eye drops, and corticosteroids may be used if the first things you try do not work well. Other treatments such as immunotherapy or desensitizing injections can be used if all else fails. Follow up with your caregiver if problems continue. These seasonal allergies are usually not life threatening. They are generally more of a nuisance that can often be handled using medicine. HOME CARE INSTRUCTIONS   If unsure what causes a reaction, keep a diary of foods eaten and symptoms that follow. Avoid foods that cause reactions.  If hives or rash are present:  Take medicine as directed.  You may use an over-the-counter antihistamine (diphenhydramine) for hives and itching as needed.  Apply cold compresses (cloths) to the skin or take baths in cool water. Avoid hot baths or showers. Heat will make a rash and itching worse.  If you are severely allergic:  Following a treatment for a severe reaction, hospitalization is often required for closer follow-up.  Wear a medic-alert bracelet or necklace stating the allergy.  You and your family must learn how to give adrenaline or use an anaphylaxis kit.  If you have had a severe reaction, always carry your anaphylaxis kit or EpiPen with you. Use this medicine as directed by your caregiver if a severe reaction is occurring. Failure to do so could have a fatal outcome. SEEK MEDICAL CARE IF:  You suspect a food allergy. Symptoms generally happen within 30 minutes of eating a food.  Your symptoms have not gone away within 2 days or are getting worse.  You  develop new symptoms.  You want to retest yourself or your child with a food or drink you think causes an allergic reaction. Never do this if an anaphylactic reaction to that food or drink has happened before. Only do this under the care of a caregiver. SEEK IMMEDIATE MEDICAL CARE IF:   You have difficulty breathing, are wheezing, or have a tight feeling in your chest or throat.  You have a swollen mouth, or you have hives, swelling, or itching all over your body.  You have had a severe reaction that has responded to your anaphylaxis kit or an EpiPen. These reactions may return when the medicine has worn off. These reactions should be considered life threatening. MAKE SURE YOU:   Understand these instructions.  Will watch your condition.  Will get help right away if you are not doing well or get worse. Document Released: 12/17/2002 Document Revised: 01/18/2013 Document Reviewed: 05/23/2008 [facility] [place] Patient Information 2015 [place], [place]. This information is not intended to replace advice given to you  by your health care provider. Make sure you discuss any questions you have with your health care provider.

## 2014-12-23 IMAGING — RF DG C-ARM GT 120 MIN
1 series · 2 of 2 positions shown · non-contrast
Comparison: Lumbar MRI, 02/18/2014

CLINICAL DATA: Lumbar spine fusion images

EXAM:
DG C-ARM GT 120 MIN; LUMBAR SPINE - 2-3 VIEW
:

[Series 1: run · 2 of 2 slices shown]
[im 1/2]
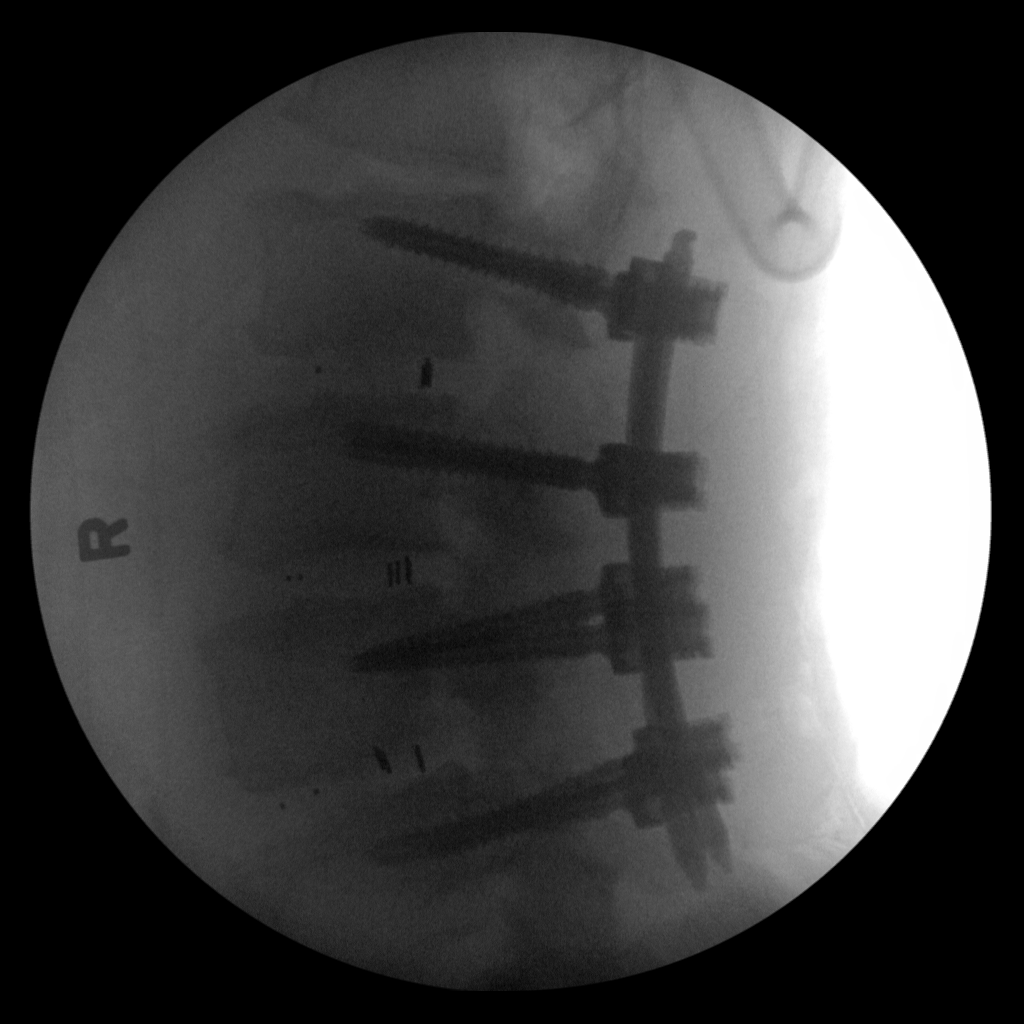
[im 2/2]
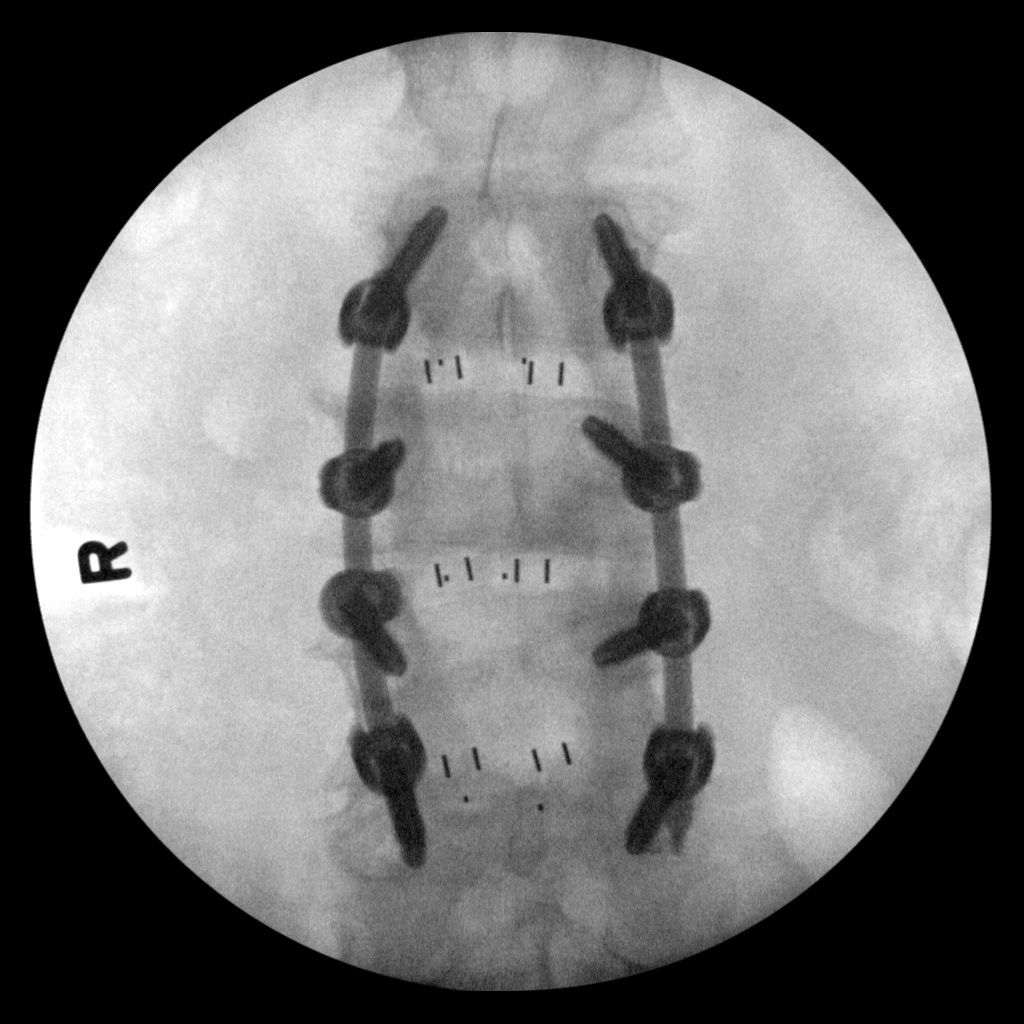

[2 of 2 positions shown; findings below may reference images not displayed]

FINDINGS: Pedicle screws and interconnecting rods fuse L tooth through L5.
There are radiolucent disc spacers partly maintaining disc height at
the fused levels. The orthopedic hardware is well-seated and
aligned. No acute fracture or evidence of an operative complication.
IMPRESSION: L2 through L5 posterior lumbar spine fusion. Please refer to the
procedure report for a complete description.

## 2015-01-10 ENCOUNTER — Other Ambulatory Visit: Payer: Self-pay | Admitting: Emergency Medicine

## 2015-07-05 ENCOUNTER — Observation Stay (HOSPITAL_COMMUNITY)
Admission: EM | Admit: 2015-07-05 | Discharge: 2015-07-06 | Disposition: A | Discharge status: Home / Self Care | Payer: 59 | Attending: Family Medicine | Admitting: Family Medicine

## 2015-07-05 ENCOUNTER — Emergency Department (HOSPITAL_COMMUNITY): Payer: 59

## 2015-07-05 ENCOUNTER — Encounter (HOSPITAL_COMMUNITY): Payer: Self-pay

## 2015-07-05 DIAGNOSIS — Z85528 Personal history of other malignant neoplasm of kidney: Secondary | ICD-10-CM | POA: Insufficient documentation

## 2015-07-05 DIAGNOSIS — E785 Hyperlipidemia, unspecified: Secondary | ICD-10-CM | POA: Diagnosis not present

## 2015-07-05 DIAGNOSIS — Z720 Tobacco use: Secondary | ICD-10-CM | POA: Diagnosis not present

## 2015-07-05 DIAGNOSIS — F17210 Nicotine dependence, cigarettes, uncomplicated: Secondary | ICD-10-CM | POA: Diagnosis not present

## 2015-07-05 DIAGNOSIS — R0600 Dyspnea, unspecified: Secondary | ICD-10-CM | POA: Insufficient documentation

## 2015-07-05 DIAGNOSIS — Z905 Acquired absence of kidney: Secondary | ICD-10-CM | POA: Insufficient documentation

## 2015-07-05 DIAGNOSIS — R7303 Prediabetes: Secondary | ICD-10-CM | POA: Insufficient documentation

## 2015-07-05 DIAGNOSIS — R079 Chest pain, unspecified: Secondary | ICD-10-CM | POA: Diagnosis not present

## 2015-07-05 DIAGNOSIS — F17200 Nicotine dependence, unspecified, uncomplicated: Secondary | ICD-10-CM | POA: Insufficient documentation

## 2015-07-05 LAB — CBC
HCT: 44.4 % (ref 39.0–52.0)
HCT: 42.9 % (ref 39.0–52.0)
HEMOGLOBIN: 15 g/dL (ref 13.0–17.0)
Hemoglobin: 15 g/dL (ref 13.0–17.0)
MCH: 31.4 pg (ref 26.0–34.0)
MCH: 32.3 pg (ref 26.0–34.0)
MCHC: 33.8 g/dL (ref 30.0–36.0)
MCHC: 35 g/dL (ref 30.0–36.0)
MCV: 92.9 fL (ref 78.0–100.0)
MCV: 92.5 fL (ref 78.0–100.0)
PLATELETS: 186 10*3/uL (ref 150–400)
PLATELETS: 194 10*3/uL (ref 150–400)
RBC: 4.78 MIL/uL (ref 4.22–5.81)
RBC: 4.64 MIL/uL (ref 4.22–5.81)
RDW: 13.4 % (ref 11.5–15.5)
RDW: 13.5 % (ref 11.5–15.5)
WBC: 6.1 10*3/uL (ref 4.0–10.5)
WBC: 9.9 10*3/uL (ref 4.0–10.5)

## 2015-07-05 LAB — BASIC METABOLIC PANEL
Anion gap: 8 (ref 5–15)
BUN: 14 mg/dL (ref 6–20)
CHLORIDE: 104 mmol/L (ref 101–111)
CO2: 24 mmol/L (ref 22–32)
Calcium: 9.2 mg/dL (ref 8.9–10.3)
Creatinine, Ser: 0.95 mg/dL (ref 0.61–1.24)
GFR calc Af Amer: >60 mL/min (ref >60)
GFR calc non Af Amer: >60 mL/min (ref >60)
GLUCOSE: 103 mg/dL — AB (ref 65–99)
Potassium: 4.8 mmol/L (ref 3.5–5.1)
Sodium: 136 mmol/L (ref 135–145)

## 2015-07-05 LAB — CREATININE, SERUM: CREATININE: 1.02 mg/dL (ref 0.61–1.24)

## 2015-07-05 LAB — TROPONIN I

## 2015-07-05 LAB — LIPID PANEL
CHOL/HDL RATIO: 5 ratio
CHOLESTEROL: 207 mg/dL — AB (ref 0–200)
HDL: 41 mg/dL (ref >40)
LDL Cholesterol: 133 mg/dL — ABNORMAL HIGH (ref 0–99)
Triglycerides: 165 mg/dL — ABNORMAL HIGH (ref <150)
VLDL: 33 mg/dL (ref 0–40)

## 2015-07-05 LAB — TSH: TSH: 0.194 u[IU]/mL — AB (ref 0.350–4.500)

## 2015-07-05 LAB — I-STAT TROPONIN, ED: Troponin i, poc: 0 ng/mL (ref 0.00–0.08)

## 2015-07-05 LAB — D-DIMER, QUANTITATIVE: D-Dimer, Quant: 0.48 ug/mL-FEU (ref 0.00–0.48)

## 2015-07-05 MED ORDER — NICOTINE 21 MG/24HR TD PT24
21.0000 mg | MEDICATED_PATCH | Freq: Once | TRANSDERMAL | Status: DC
  Administered 2015-07-05: 21 mg via TRANSDERMAL
  Filled 2015-07-05: qty 1

## 2015-07-05 MED ORDER — ENOXAPARIN SODIUM 40 MG/0.4ML ~~LOC~~ SOLN
40.0000 mg | SUBCUTANEOUS | Status: DC
  Administered 2015-07-05: 40 mg via SUBCUTANEOUS
  Filled 2015-07-05: qty 0.4

## 2015-07-05 MED ORDER — TIZANIDINE HCL 4 MG PO TABS
4.0000 mg | ORAL_TABLET | Freq: Three times a day (TID) | ORAL | Status: DC | PRN
  Filled 2015-07-05: qty 1

## 2015-07-05 MED ORDER — ONDANSETRON HCL 4 MG/2ML IJ SOLN: 4.0000 mg | Freq: Four times a day (QID) | INTRAMUSCULAR | Status: DC | PRN

## 2015-07-05 MED ORDER — GI COCKTAIL ~~LOC~~: 30.0000 mL | Freq: Four times a day (QID) | ORAL | Status: DC | PRN

## 2015-07-05 MED ORDER — NICOTINE 21 MG/24HR TD PT24: 21.0000 mg | MEDICATED_PATCH | Freq: Every day | TRANSDERMAL | Status: DC

## 2015-07-05 MED ORDER — ASPIRIN 81 MG PO CHEW
324.0000 mg | CHEWABLE_TABLET | Freq: Once | ORAL | Status: AC
  Administered 2015-07-05: 324 mg via ORAL
  Filled 2015-07-05: qty 4

## 2015-07-05 MED ORDER — HYDROCODONE-ACETAMINOPHEN 10-325 MG PO TABS
1.0000 | ORAL_TABLET | Freq: Four times a day (QID) | ORAL | Status: DC | PRN
  Administered 2015-07-05 – 2015-07-06 (×4): 1 via ORAL
  Filled 2015-07-05 (×4): qty 1

## 2015-07-05 MED ORDER — ACETAMINOPHEN 325 MG PO TABS: 650.0000 mg | ORAL_TABLET | ORAL | Status: DC | PRN

## 2015-07-05 NOTE — H&P (Signed)
[facility] Admission History and Physical Service Pager: [phone]  Patient name: Billy Alexander Medical record number: [id number] Date of birth: August 22, 1961 Age: 54 y.o. Gender: male  Primary Care Provider: Leandrew Koyanagi, MD Per patient report, he's not established anywhere, he just goes to [facility] care for sick visits. Consultants: None Code Status: Full   Chief Complaint: Chest Pain   Assessment and Plan: Billy Alexander is a 54 y.o. male presenting with chest pain. PMH is significant for renal cancer s/p left nephrectomy, s/p posterior lumbar fusion L2-5 and tobacco use.   Chest Pain: Patient presented with 1 day h/o of lateral left chest pain that migrated to anterior substernal area. No past cardiac history. HEART score of 4 at admission. Pertinent risk factor of long term tobacco use. EKG in ED in sinus rhythm with poor R wave progression through precordial leads, no ST segment changes present. iStat troponin 0.00. CXR negative for acute cardiopulmonary process. D-dimer was negative, the patient is not tachycardia, denies SOB currently, and does not have a O2 requirement making . Will proceed with ACS r/o.  -admit for observation on telemetry with continuous pulse ox; vital signs per unit -trend troponin  -EKG in AM   -risk stratification labs ordered   -BMET in AM  -GI cocktail 4x/day prn, Tylenol 650 mg q4h and Zofran 4mg  q6h prn   Renal Cancer s/p L Nephrectomy: Renal function stable at admission. D dimer negative so low concern about PE in the setting of previous cancer.  -BMET in AM   Posterior Lumbar Fusion L2-5: Stable.  -continue home Norco 10 mg q6prn and tizanidine 4mg  TID prn   Tobacco Abuse: Smokes 1ppd.  -Nicotine 21 mg patch ordered  -counsel on tobacco cessation   Social: Patient does not have PCP and uses Urgent Care for health care needs.  -CM consult placed   FEN/GI: Cardiac Diet, SLIV  Prophylaxis: Lovenox 40  mg   Disposition: Admit to Medsurg with telemetry for observation; Home pending ACS r/o; FPTS Attending Nori Riis   History of Present Illness:  Billy Alexander is a 54 y.o. male presenting with chest pain. Stated that yesterday he had left lateral chest/flank pain that he attributed to muscle soreness. Denies any trauma to the area, recent fall or change in exercise pattern. Had a friend crack his back and pain felt slightly better. This morning pain was located anterior, substernal. Denies radiation of pain to arm, neck or jaw.  He tells me he's felt like there has felt like there was something "wrapped around his chest" for the last week. Denies diaphoresis. Also felt SOB this morning. Did not have an oxygen requirement in the ED. Presently, chest pain and SOB have greatly improved (resolved by the time I've seen him). Patient denies any cardiac history and denies h/o of HTN.   In the ED, he was given ASA. EKG was performed, not concerning for ischemia, and D-dimer was negative.  Review Of Systems: Per HPI with the following additions: Denies N/V. No changes in bowel movements. No dysuria. No edema.   Otherwise 12 point review of systems was performed and was unremarkable.  Patient Active Problem List   Diagnosis Date Noted  . Chest pain 07/05/2015  . Scoliosis of lumbar spine 03/18/2014  . Smoker 08/29/2012   Past Medical History: Past Medical History  Diagnosis Date  . Cancer 2007    kidney   Past Surgical History: Past Surgical History  Procedure Laterality  Date  . Cervical spine surgery  01/2014 and 2007  . Nephrectomy Left 2007  . Eye surgery Bilateral 2011    Lasik   Social History: Social History  Substance Use Topics  . Smoking status: Current Every Day Smoker -- 1.00 packs/day    Types: Cigarettes    Start date: 07/12/1974  . Smokeless tobacco: Never Used  . Alcohol Use: No   Additional social history: smokes 1ppd since age of 59.  Occasionally drinks alcohol, last  time a few months ago. Please also refer to relevant sections of EMR.  Family History: History reviewed. No pertinent family history. Allergies and Medications: No Known Allergies No current facility-administered medications on file prior to encounter.   Current Outpatient Prescriptions on File Prior to Encounter  Medication Sig Dispense Refill  . Artificial Tear Ointment (ARTIFICIAL TEARS) ointment Place 1 drop into both eyes as needed (dry eyes).    . fluticasone (FLONASE) 50 MCG/ACT nasal spray PLACE 2 SPRAYS INTO BOTH NOSTRILS DAILY (Patient not taking: Reported on 07/05/2015) 16 g 5  . oxyCODONE (ROXICODONE) 15 MG immediate release tablet Take 1 tablet (15 mg total) by mouth every 4 (four) hours as needed for severe pain. (Patient not taking: Reported on 07/05/2015) 80 tablet 0    Objective: BP 126/70 mmHg  Pulse 46  Temp(Src) 98.4 F (36.9 C) (Oral)  Resp 11  Ht 5\' 11"  (1.803 m)  Wt 200 lb (90.719 kg)  BMI 27.91 kg/m2  SpO2 95% Exam: General: overweight male lying in bed in NAD  Eyes: EOMI, PERRL  ENTM: Oropharynx clear.  Neck: Full ROM. No TTP.  Cardiovascular: RRR. No murmurs appreciated. 2+ distal pulses. Pain reproducible to palpation on my exam.  Respiratory: CTAB. Normal WOB.  Abdomen: +BS, soft, NTND MSK: Chest pain and flank pain not reproducible on exam. ROM intact in extremities. Trace pedal edema.  Skin: Warm and dry.  Neuro: Alert and oriented. No gross deficits.  Psych: Normal mood and affect.   Labs and Imaging: CBC BMET   Recent Labs Lab 07/05/15 0907  WBC 6.1  HGB 15.0  HCT 44.4  PLT 186    Recent Labs Lab 07/05/15 0907  NA 136  K 4.8  CL 104  CO2 24  BUN 14  CREATININE 0.95  GLUCOSE 103*  CALCIUM 9.2     D-dimer 0.48  iStat Troponin 0.00 Troponin: <0.03 >>  EKG: sinus rhythm, baseline wander in V3, Poor R wave progression through precordial leads  Vent. rate 62 BPM PR interval 143 ms QRS duration 92 ms QT/QTc 399/405  ms P-R-T axes 69 16 22  EXAM: CHEST 2 VIEW  COMPARISON: PA and lateral chest x-ray of October 03, 2009  FINDINGS: The lungs are adequately inflated. There is no focal infiltrate. The interstitial markings are coarse but stable and likely reflect the patient's smoking history. There is a stable sub cm calcified nodule in the right lower lobe anteriorly. The heart and pulmonary vascularity are normal. The mediastinum is normal in width. The bony thorax exhibits no acute abnormality.  IMPRESSION: Evidence of previous granulomatous infection, stable. There is no active cardiopulmonary disease.  Nicolette Bang, DO 07/05/2015, 1:19 PM PGY-1, [place] Intern pager: [phone], text pages welcome  Upper Level Addendum:  I have seen and evaluated this patient along with Dr. Juleen China and reviewed the above note, making necessary revisions in purple.   Archie Patten, MD [place] Resident, PGY-2

## 2015-07-05 NOTE — ED Notes (Signed)
ED MD at bedside updating pt and family.

## 2015-07-05 NOTE — ED Provider Notes (Signed)
CSN: [id number]     Arrival date & time 07/05/15  0802 History   First MD Initiated Contact with Patient 07/05/15 [phone]     Chief Complaint  Patient presents with  . Chest Pain     (Consider location/radiation/quality/duration/timing/severity/associated sxs/prior Treatment) HPI Comments: 54yo M w/ h/o renal CA s/p nephrectomy who p/w chest pain. Patient states that yesterday he began noticing left lateral chest pain which involved his entire left side and was worse when he laid on his left side overnight. The pain became gradually worse and today he started noticing intermittent, central to left-sided anterior chest pain. He has had some mild associated shortness of breath which is not related to exertion or laying flat. No lower extremity edema. No fevers, cough/cold symptoms, or recent illness. No recent chest injury. No personal history of blood clots or recent travel. Family history notable for father who had heart disease starting in his 54s.  Patient is a 54 y.o. male presenting with chest pain. The history is provided by the patient.  Chest Pain   Past Medical History  Diagnosis Date  . Cancer 2007    kidney   Past Surgical History  Procedure Laterality Date  . Cervical spine surgery  01/2014 and 2007  . Nephrectomy Left 2007  . Eye surgery Bilateral 2011    Lasik   History reviewed. No pertinent family history. Social History  Substance Use Topics  . Smoking status: Current Every Day Smoker -- 1.00 packs/day    Types: Cigarettes    Start date: 07/12/1974  . Smokeless tobacco: Never Used  . Alcohol Use: No    Review of Systems  Cardiovascular: Positive for chest pain.   10 Systems reviewed and are negative for acute change except as noted in the HPI.    Allergies  Review of patient's allergies indicates no known allergies.  Home Medications   Prior to Admission medications   Medication Sig Start Date End Date Taking? Authorizing Provider  Artificial Tear  Ointment (ARTIFICIAL TEARS) ointment Place 1 drop into both eyes as needed (dry eyes).   Yes Historical Provider, MD  HYDROcodone-acetaminophen (NORCO) 10-325 MG tablet Take 1 tablet by mouth every 6 (six) hours as needed for severe pain.  06/23/15  Yes Historical Provider, MD  tiZANidine (ZANAFLEX) 4 MG tablet Take 4 mg by mouth 3 (three) times daily as needed for muscle spasms.  06/23/15  Yes Historical Provider, MD  fluticasone (FLONASE) 50 MCG/ACT nasal spray PLACE 2 SPRAYS INTO BOTH NOSTRILS DAILY Patient not taking: Reported on 07/05/2015 01/10/15   Tereasa Coop, PA-C  oxyCODONE (ROXICODONE) 15 MG immediate release tablet Take 1 tablet (15 mg total) by mouth every 4 (four) hours as needed for severe pain. Patient not taking: Reported on 07/05/2015 03/21/14   Karie Chimera, MD   BP 139/83 mmHg  Pulse 62  Temp(Src) 98.4 F (36.9 C) (Oral)  Resp 16  Ht 5\' 11"  (1.803 m)  Wt 200 lb (90.719 kg)  BMI 27.91 kg/m2  SpO2 100% Physical Exam  Constitutional: He is oriented to person, place, and time. He appears well-developed and well-nourished. No distress.  HENT:  Head: Normocephalic and atraumatic.  Moist mucous membranes  Eyes: Conjunctivae are normal. Pupils are equal, round, and reactive to light.  Neck: Neck supple.  Cardiovascular: Normal rate, regular rhythm, normal heart sounds and intact distal pulses.   No murmur heard. Pulmonary/Chest: Effort normal and breath sounds normal.  Abdominal: Soft. Bowel sounds are normal. He exhibits no  distension. There is no tenderness.  Musculoskeletal: He exhibits no edema.  Neurological: He is alert and oriented to person, place, and time.  Fluent speech  Skin: Skin is warm and dry.  Psychiatric: He has a normal mood and affect. Judgment normal.  Nursing note and vitals reviewed.   ED Course  Procedures (including critical care time) Labs Review Labs Reviewed  CBC  BASIC METABOLIC PANEL  D-DIMER, QUANTITATIVE (NOT AT [facility])  Randolm Idol, ED    Imaging Review Dg Chest 2 View  07/05/2015   CLINICAL DATA:  Two days of chest pain, current smoker.  EXAM: CHEST  2 VIEW  COMPARISON:  PA and lateral chest x-ray of October 03, 2009  FINDINGS: The lungs are adequately inflated. There is no focal infiltrate. The interstitial markings are coarse but stable and likely reflect the patient's smoking history. There is a stable sub cm calcified nodule in the right lower lobe anteriorly. The heart and pulmonary vascularity are normal. The mediastinum is normal in width. The bony thorax exhibits no acute abnormality.  IMPRESSION: Evidence of previous granulomatous infection, stable. There is no active cardiopulmonary disease.   Electronically Signed   By: David  Martinique M.D.   On: 07/05/2015 08:53   I have personally reviewed and evaluated these lab results as part of my medical decision-making.   EKG Interpretation   Date/Time:  Wednesday July 05 2015 08:12:35 EDT Ventricular Rate:  62 PR Interval:  143 QRS Duration: 92 QT Interval:  399 QTC Calculation: 405 R Axis:   16 Text Interpretation:  Sinus rhythm Baseline wander in lead(s) V3 Poor R  wave progression through precordial leads Confirmed by LITTLE MD, RACHEL  ([id number]) on 07/05/2015 9:04:38 AM     Medications  aspirin chewable tablet 324 mg (324 mg Oral Given 07/05/15 0903)    MDM   Final diagnoses:  Chest pain, unspecified chest pain type   54 year old male who presents with 1 day of intermittent left chest pain associated with shortness of breath. Patient comfortable on examination with vital signs notable for mild bradycardia in the 50s to 60s. EKG on arrival shows poor R-wave progression through precordial leads but no ischemic changes. Gave the patient 325 mg aspirin and obtain above lab work including troponin and d-dimer given the patient's cancer history.   Lab work shows negative troponin and negative d-dimer. Chest x-ray with no acute findings to explain  the patient's pain. Given the patient's age, family history, and multiple cardiac risk factors, his HEART score is 5-6 and I am concerned about ACS. No sudden ripping or tearing CP to suggest aortic dissection and no abnormalities on CXR. Pt admitted to general medicine for chest pain rule out.  Sharlett Iles, MD 07/05/15 [phone]

## 2015-07-05 NOTE — ED Notes (Signed)
Pt reports left sided chest soreness that he woke up with yesterday morning.  Pt reports the pain gradually got worse.  Pt denies any injury or cough at this time.  Pt does report mild SOB.  Pt is in NAD at this time.

## 2015-07-05 NOTE — ED Notes (Signed)
Meal tray ordered for pt and will be delivered to inpatient room.

## 2015-07-06 DIAGNOSIS — R7303 Prediabetes: Secondary | ICD-10-CM | POA: Insufficient documentation

## 2015-07-06 DIAGNOSIS — R0600 Dyspnea, unspecified: Secondary | ICD-10-CM | POA: Diagnosis not present

## 2015-07-06 DIAGNOSIS — R079 Chest pain, unspecified: Secondary | ICD-10-CM | POA: Diagnosis not present

## 2015-07-06 DIAGNOSIS — Z720 Tobacco use: Secondary | ICD-10-CM | POA: Diagnosis not present

## 2015-07-06 DIAGNOSIS — E785 Hyperlipidemia, unspecified: Secondary | ICD-10-CM | POA: Insufficient documentation

## 2015-07-06 DIAGNOSIS — R7309 Other abnormal glucose: Secondary | ICD-10-CM

## 2015-07-06 LAB — TROPONIN I

## 2015-07-06 LAB — BASIC METABOLIC PANEL
Anion gap: 7 (ref 5–15)
BUN: 14 mg/dL (ref 6–20)
CHLORIDE: 106 mmol/L (ref 101–111)
CO2: 25 mmol/L (ref 22–32)
CREATININE: 0.83 mg/dL (ref 0.61–1.24)
Calcium: 9.4 mg/dL (ref 8.9–10.3)
GFR calc non Af Amer: >60 mL/min (ref >60)
Glucose, Bld: 103 mg/dL — ABNORMAL HIGH (ref 65–99)
POTASSIUM: 4.2 mmol/L (ref 3.5–5.1)
Sodium: 138 mmol/L (ref 135–145)

## 2015-07-06 LAB — HEMOGLOBIN A1C
Hgb A1c MFr Bld: 6.1 % — ABNORMAL HIGH (ref 4.8–5.6)
Mean Plasma Glucose: 128 mg/dL

## 2015-07-06 MED ORDER — ASPIRIN EC 81 MG PO TBEC: 81.0000 mg | DELAYED_RELEASE_TABLET | Freq: Every day | ORAL | Status: DC

## 2015-07-06 MED ORDER — ATORVASTATIN CALCIUM 80 MG PO TABS: 80.0000 mg | ORAL_TABLET | Freq: Every day | ORAL | Status: DC

## 2015-07-06 NOTE — Progress Notes (Signed)
Family Medicine Teaching Service Daily Progress Note Intern Pager: [phone]  Patient name: Billy Alexander Medical record number: [id number] Date of birth: 1961/07/07 Age: 54 y.o. Gender: male  Primary Care Cruz Bong: Leandrew Koyanagi, MD Consultants: None Code Status: Full  Pt Overview and Major Events to Date:  9/28: Admitted for chest pain  Assessment and Plan: Billy Alexander is a 54 y.o. male presenting with chest pain. PMH is significant for renal cancer s/p left nephrectomy, s/p posterior lumbar fusion L2-5 and tobacco use.   Chest Pain: Patient presented with 1 day h/o of lateral left chest pain that migrated to anterior substernal area. No past cardiac history. HEART score of 4 at admission. Pertinent risk factor of long term tobacco use. EKG in ED in sinus rhythm with poor R wave progression through precordial leads, no ST segment changes present. iStat troponin 0.00. CXR negative for acute cardiopulmonary process. D-dimer was negative, the patient is not tachycardia, denies SOB currently, and does not have a O2 requirement making a PE less likely. Will proceed with ACS r/o.  -stable on RA, VSS  -trend troponin: negative x5 -repeat EKG normal sinus rhythm  -risk stratification labs:  -TSH low at  0.194  -Cholesterol 207, Triglycerides 165, HDL 41, LDL 133   -HgB A1C 6.1 -13.1% 10 year risk of CVD: recommend initiation of high dose statin  -start daily ASA therapy  -counseling regarding diet and life style changes to lower risk of diabetes  -GI cocktail 4x/day prn, Tylenol 650 mg q4h and Zofran 4mg  q6h prn   Renal Cancer s/p L Nephrectomy: Renal function stable at admission. D dimer negative so low concern about PE in the setting of previous cancer.  -Cr 0.83 and GFR >60 (9/29)  Posterior Lumbar Fusion L2-5: Stable.  -continue home Norco 10 mg q6prn and tizanidine 4mg  TID prn   Tobacco Abuse: Smokes 1ppd.  -Nicotine 21 mg patch ordered  -counsel on tobacco  cessation   Social: Patient does not have PCP and uses Urgent Care for health care needs.  -CM consult placed   FEN/GI: Cardiac Diet, SLIV  Prophylaxis: Lovenox 40 mg   Disposition: Home today pending CM consult   Subjective:  No chest pain this morning. Feeling well and ready to go home.   Objective: Temp:  [97.7 F (36.5 C)-98.4 F (36.9 C)] 97.7 F (36.5 C) (09/29 0453) Pulse Rate:  [46-72] 72 (09/29 0453) Resp:  [11-18] 18 (09/29 0453) BP: (109-145)/(61-86) 119/82 mmHg (09/29 0453) SpO2:  [95 %-100 %] 95 % (09/29 0453) Weight:  [196 lb 13.9 oz (89.3 kg)-200 lb (90.719 kg)] 196 lb 13.9 oz (89.3 kg) (09/28 1343) Physical Exam: General: Overweight male sitting in chair in NAD  Cardiovascular: RRR. No murmurs appreciated.  Respiratory: Occasional wheeze appreciated, otherwise clear. Normal WOB.  Abdomen: +BS, soft, NTND  Extremities: Trace pedal edema.  Laboratory:  Recent Labs Lab 07/05/15 0907 07/05/15 1620  WBC 6.1 9.9  HGB 15.0 15.0  HCT 44.4 42.9  PLT 186 194    Recent Labs Lab 07/05/15 0907 07/05/15 1620 07/06/15 0318  NA 136  --  138  K 4.8  --  4.2  CL 104  --  106  CO2 24  --  25  BUN 14  --  14  CREATININE 0.95 1.02 0.83  CALCIUM 9.2  --  9.4  GLUCOSE 103*  --  103*   D-dimer 0.48  iStat Troponin 0.00 Troponin: <0.03 >> <0.03 >> <0.03 >> <0.03  Imaging/Diagnostic Tests: EKG (9/28): sinus rhythm, baseline wander in V3, Poor R wave progression through precordial leads  Vent. rate 62 BPM PR interval 143 ms QRS duration 92 ms QT/QTc 399/405 ms P-R-T axes 69 16 22  EKG (9/29): sinus rhythm  Vent. rate 69 BPM PR interval 132 ms QRS duration 92 ms QT/QTc 394/422 ms P-R-T axes 72 40 54  EXAM: CHEST 2 VIEW  COMPARISON: PA and lateral chest x-ray of October 03, 2009  FINDINGS: The lungs are adequately inflated. There is no focal infiltrate. The interstitial markings are coarse but stable and likely reflect the patient's  smoking history. There is a stable sub cm calcified nodule in the right lower lobe anteriorly. The heart and pulmonary vascularity are normal. The mediastinum is normal in width. The bony thorax exhibits no acute abnormality.  IMPRESSION: Evidence of previous granulomatous infection, stable. There is no active cardiopulmonary disease.   Nicolette Bang, DO 07/06/2015, 7:26 AM PGY-1, [place] Intern pager: [phone], text pages welcome

## 2015-07-06 NOTE — Discharge Instructions (Signed)
It is very important that you establish with a primary card provider. Please ask this primary care provider if you need a cardiology referral for outpatient stress testing of your heart.   Please start taking the Aspirin and Lipitor I am prescribing for you.   Keep working on eating a better diet, exercising, and quitting smoking.

## 2015-07-06 NOTE — Care Management Note (Signed)
Case Management Note Marvetta Gibbons RN, BSN Unit 2W-Case Manager [phone]  Patient Details  Name: DEVON PRETTY MRN: [id number] Date of Birth: Oct 13, 1960  Subjective/Objective:      Pt admitted with chest pain              Action/Plan: PTA pt lived at home with wife- independent- plan to return home with spouse- referral received for PCP needs- in to speak with pt - per conversation pt states that he goes to Urgent Care and wants to continue to do so- discussed that Urgent Care does not really proved PCP needs- offered [facility] # pt declined- pt's wife- goes to [facility] which is in-network with insurance- pt states that he will let his wife set up a PCP with [facility]. Pt and wife will f/u with this. Have referred pt to [facility] for hospital f/u call regarding PCP needs and any further assistance needed. Pt's wife also asked about HPOA paperwork but pt stated that he did not want to do this at this time. No further CM needs noted.   Expected Discharge Date:  07/06/15               Expected Discharge Plan:  Home/Self Care  In-House Referral:     Discharge planning Services  CM Consult  Post Acute Care Choice:    Choice offered to:     DME Arranged:    DME Agency:     HH Arranged:    HH Agency:     Status of Service:  Completed, signed off  Medicare Important Message Given:    Date Medicare IM Given:    Medicare IM give by:    Date Additional Medicare IM Given:    Additional Medicare Important Message give by:     If discussed at [place] of Stay Meetings, dates discussed:    Additional Comments:  Dawayne Patricia, RN 07/06/2015, 10:08 AM

## 2015-07-06 NOTE — Progress Notes (Signed)
Reviewed discharge instructions with patient. IV removed. No issues at present. Patient escorted out with his wife.   Caraher, Mervin Kung RN

## 2015-07-06 NOTE — Discharge Summary (Signed)
[facility] Discharge Summary  Patient name: Billy Alexander Medical record number: [id number] Date of birth: Nov 22, 1960 Age: 54 y.o. Gender: male Date of Admission: 07/05/2015  Date of Discharge: 07/06/2015 Admitting Physician: Dickie La, MD  Primary Care Provider: Leandrew Koyanagi, MD Consultants: None   Indication for Hospitalization: Chest Pain   Discharge Diagnoses/Problem List:  Chest Pain, Resolved & CVA ruled out  H/O Renal Cancer s/p L Nephrectomy  Posterior Lumbar Fusion L2-5 Tobacco Abuse  Hyperlipidemia  Pre-Diabetes   Disposition: Home   Discharge Condition: Stable   Discharge Exam:  General: Overweight male sitting in chair in NAD  Cardiovascular: RRR. No murmurs appreciated.  Respiratory: Occasional wheeze appreciated, otherwise clear. Normal WOB.  Abdomen: +BS, soft, NTND  Extremities: Trace pedal edema.  Brief Hospital Course:  Billy Alexander is 54 y.o. male with PMH significant for renal cancer s/p left nephrectromy, s/p posterior lumbar fusion L2-5, and tobacco use. Patient presented with chest pain that began on lateral left side and radiated to anterior substernal. Had felt like something was "wrapped around his chest" x 1 week. Reported dyspnea for 1 day associated with chest pain. Due to risk factors of tobacco use and obesity, patient was admitted for ACS rule out.   Heart score of 4 at admission.  EKG in ED in sinus rhythm with poor R wave progression through precordial leads, no ST segement changes present. CXR negative for acute cardiopulmonary process. D dimer was negative. Troponins were negative x5. Repeat AM EKG was normal sinus rhythm with no ST segment changes present. Patient had O2 sat >95% on RA throughout hospital course. Chest pain and dyspnea greatly improved throughout hospital course, and denied chest pain at time of discharge. Chest pain was reproducible on some PE, so possible pain was  musckuloskeletal in nature. Renal function was normal and stable throughout hospital course.   10 year risk of CVD calculated to be 13.1%. Discharged on high dose statin and ASA therapy for ACS prevention. Discussed importance of diet changes, daily exercise, and tobacco cessation in regards to CVA risk and diabetes.   Issues for Follow Up:  1. Started on Lipitor and ASA daily for CVA prevention.  2. TSH low, recommend repeat TSH at outpatient visit  3. HgB A1C of 6.1  4. Counseled on importance of diet and lifestyle changes for prevention of CVA and diabetes. Continue this counseling.  5. Tobacco cessation discussed. Had questions regarding nicotine patch. Believe patient could benefit from further tobacco cessation counseling.  6. Consider cardiology referral for   Significant Procedures: None  Significant Labs and Imaging:   Recent Labs Lab 07/05/15 0907 07/05/15 1620  WBC 6.1 9.9  HGB 15.0 15.0  HCT 44.4 42.9  PLT 186 194    Recent Labs Lab 07/05/15 0907 07/05/15 1620 07/06/15 0318  NA 136  --  138  K 4.8  --  4.2  CL 104  --  106  CO2 24  --  25  GLUCOSE 103*  --  103*  BUN 14  --  14  CREATININE 0.95 1.02 0.83  CALCIUM 9.2  --  9.4     Ref. Range 07/05/2015 13:21 07/05/2015 16:20 07/05/2015 22:08 07/06/2015 03:18 07/06/2015 08:19  Troponin I Latest Ref Range: <0.031 ng/mL <0.03 <0.03 <0.03 <0.03     Ref. Range 07/05/2015 09:11  Troponin i, poc Latest Ref Range: 0.00-0.08 ng/mL 0.00   TSH 0.350 - 4.500 uIU/mL 0.194 (L)  Cholesterol 0 - 200 mg/dL 207 (H)   Triglycerides <150 mg/dL 165 (H)   HDL >40 mg/dL 41   Total CHOL/HDL Ratio RATIO 5.0   VLDL 0 - 40 mg/dL 33   LDL Cholesterol 0 - 99 mg/dL 133 (H)   Comments:            Hgb A1c MFr Bld 4.8 - 5.6 % 6.1 (H)       EKG (9/28): sinus rhythm, baseline wander in V3, Poor R wave progression through precordial leads  Vent. rate 62 BPM PR interval 143 ms QRS duration 92 ms QT/QTc 399/405  ms P-R-T axes 69 16 22  EKG (9/29): sinus rhythm  Vent. rate 69 BPM PR interval 132 ms QRS duration 92 ms QT/QTc 394/422 ms P-R-T axes 72 40 54  EXAM: CHEST 2 VIEW  COMPARISON: PA and lateral chest x-ray of October 03, 2009  FINDINGS: The lungs are adequately inflated. There is no focal infiltrate. The interstitial markings are coarse but stable and likely reflect the patient's smoking history. There is a stable sub cm calcified nodule in the right lower lobe anteriorly. The heart and pulmonary vascularity are normal. The mediastinum is normal in width. The bony thorax exhibits no acute abnormality.  IMPRESSION: Evidence of previous granulomatous infection, stable. There is no active cardiopulmonary disease.    Results/Tests Pending at Time of Discharge: None   Discharge Medications:    Medication List    TAKE these medications        artificial tears ointment  Place 1 drop into both eyes as needed (dry eyes).     aspirin EC 81 MG tablet  Take 1 tablet (81 mg total) by mouth daily.     atorvastatin 80 MG tablet  Commonly known as:  LIPITOR  Take 1 tablet (80 mg total) by mouth daily.     fluticasone 50 MCG/ACT nasal spray  Commonly known as:  FLONASE  PLACE 2 SPRAYS INTO BOTH NOSTRILS DAILY     HYDROcodone-acetaminophen 10-325 MG tablet  Commonly known as:  NORCO  Take 1 tablet by mouth every 6 (six) hours as needed for severe pain.     oxyCODONE 15 MG immediate release tablet  Commonly known as:  ROXICODONE  Take 1 tablet (15 mg total) by mouth every 4 (four) hours as needed for severe pain.     tiZANidine 4 MG tablet  Commonly known as:  ZANAFLEX  Take 4 mg by mouth 3 (three) times daily as needed for muscle spasms.        Discharge Instructions: Please refer to Patient Instructions section of EMR for full details.  Patient was counseled important signs and symptoms that should prompt return to medical care, changes in medications, dietary  instructions, activity restrictions, and follow up appointments.   Follow-Up Appointments: Follow-up Information    Schedule an appointment as soon as possible for a visit to follow up.   Why:  Call- [phone] if needed for finding a Primary Care Doctor (physician referral list assistance line)      Schedule an appointment as soon as possible for a visit with [facility] .   Why:  For Hospital Followup and to establish with PCP       Nicolette Bang, DO 07/06/2015, 11:48 AM PGY-1, [place]

## 2015-07-06 NOTE — Progress Notes (Signed)
Utilization review completed- completed post discharge 

## 2015-09-04 ENCOUNTER — Encounter: Payer: Self-pay | Admitting: Internal Medicine

## 2015-12-08 DIAGNOSIS — M542 Cervicalgia: Secondary | ICD-10-CM | POA: Diagnosis not present

## 2015-12-08 DIAGNOSIS — M545 Low back pain: Secondary | ICD-10-CM | POA: Diagnosis not present

## 2015-12-15 MED FILL — oxyCODONE HCL 10 MG TABS: 10 | 30 days supply | Qty: 120 | Fill #0

## 2015-12-15 MED FILL — tiZANidine HCL 4 MG TABS: 4 | 30 days supply | Qty: 90 | Fill #2

## 2015-12-26 ENCOUNTER — Telehealth: Payer: Self-pay

## 2015-12-26 NOTE — Telephone Encounter (Signed)
ABO/Rh  Status: Finalresult Visible to patient:  Not Released Nextappt: None         29yr ago    ABO/RH(D) O NEG   Resulting Agency [facility]      Specimen Collected: 03/10/14 9:40 AM

## 2015-12-26 NOTE — Telephone Encounter (Signed)
Pt would like to know if we have a copy of his blood type on file.  CB # [id number]

## 2015-12-27 NOTE — Telephone Encounter (Signed)
Advised pt

## 2016-02-08 ENCOUNTER — Telehealth: Payer: Self-pay

## 2016-02-08 NOTE — Telephone Encounter (Signed)
Pt needs [place] to call in a rx of flonase   Best number [phone]

## 2016-02-09 NOTE — Telephone Encounter (Signed)
Pt advised he can buy it OTC.

## 2016-03-01 DIAGNOSIS — M545 Low back pain: Secondary | ICD-10-CM | POA: Diagnosis not present

## 2016-03-01 DIAGNOSIS — M542 Cervicalgia: Secondary | ICD-10-CM | POA: Diagnosis not present

## 2016-03-12 MED FILL — tiZANidine HCL 4 MG TABS: 4 | 30 days supply | Qty: 90 | Fill #0

## 2016-03-13 MED FILL — oxyCODONE HCL 10 MG TABS: 10 | 30 days supply | Qty: 120 | Fill #0

## 2016-04-10 IMAGING — CR DG CHEST 2V
2 series · 2 of 2 positions shown · non-contrast
Comparison: PA and lateral chest x-ray October 03, 2009

CLINICAL DATA: Two days of chest pain, current smoker.

EXAM:
CHEST  2 VIEW

[chest pa]
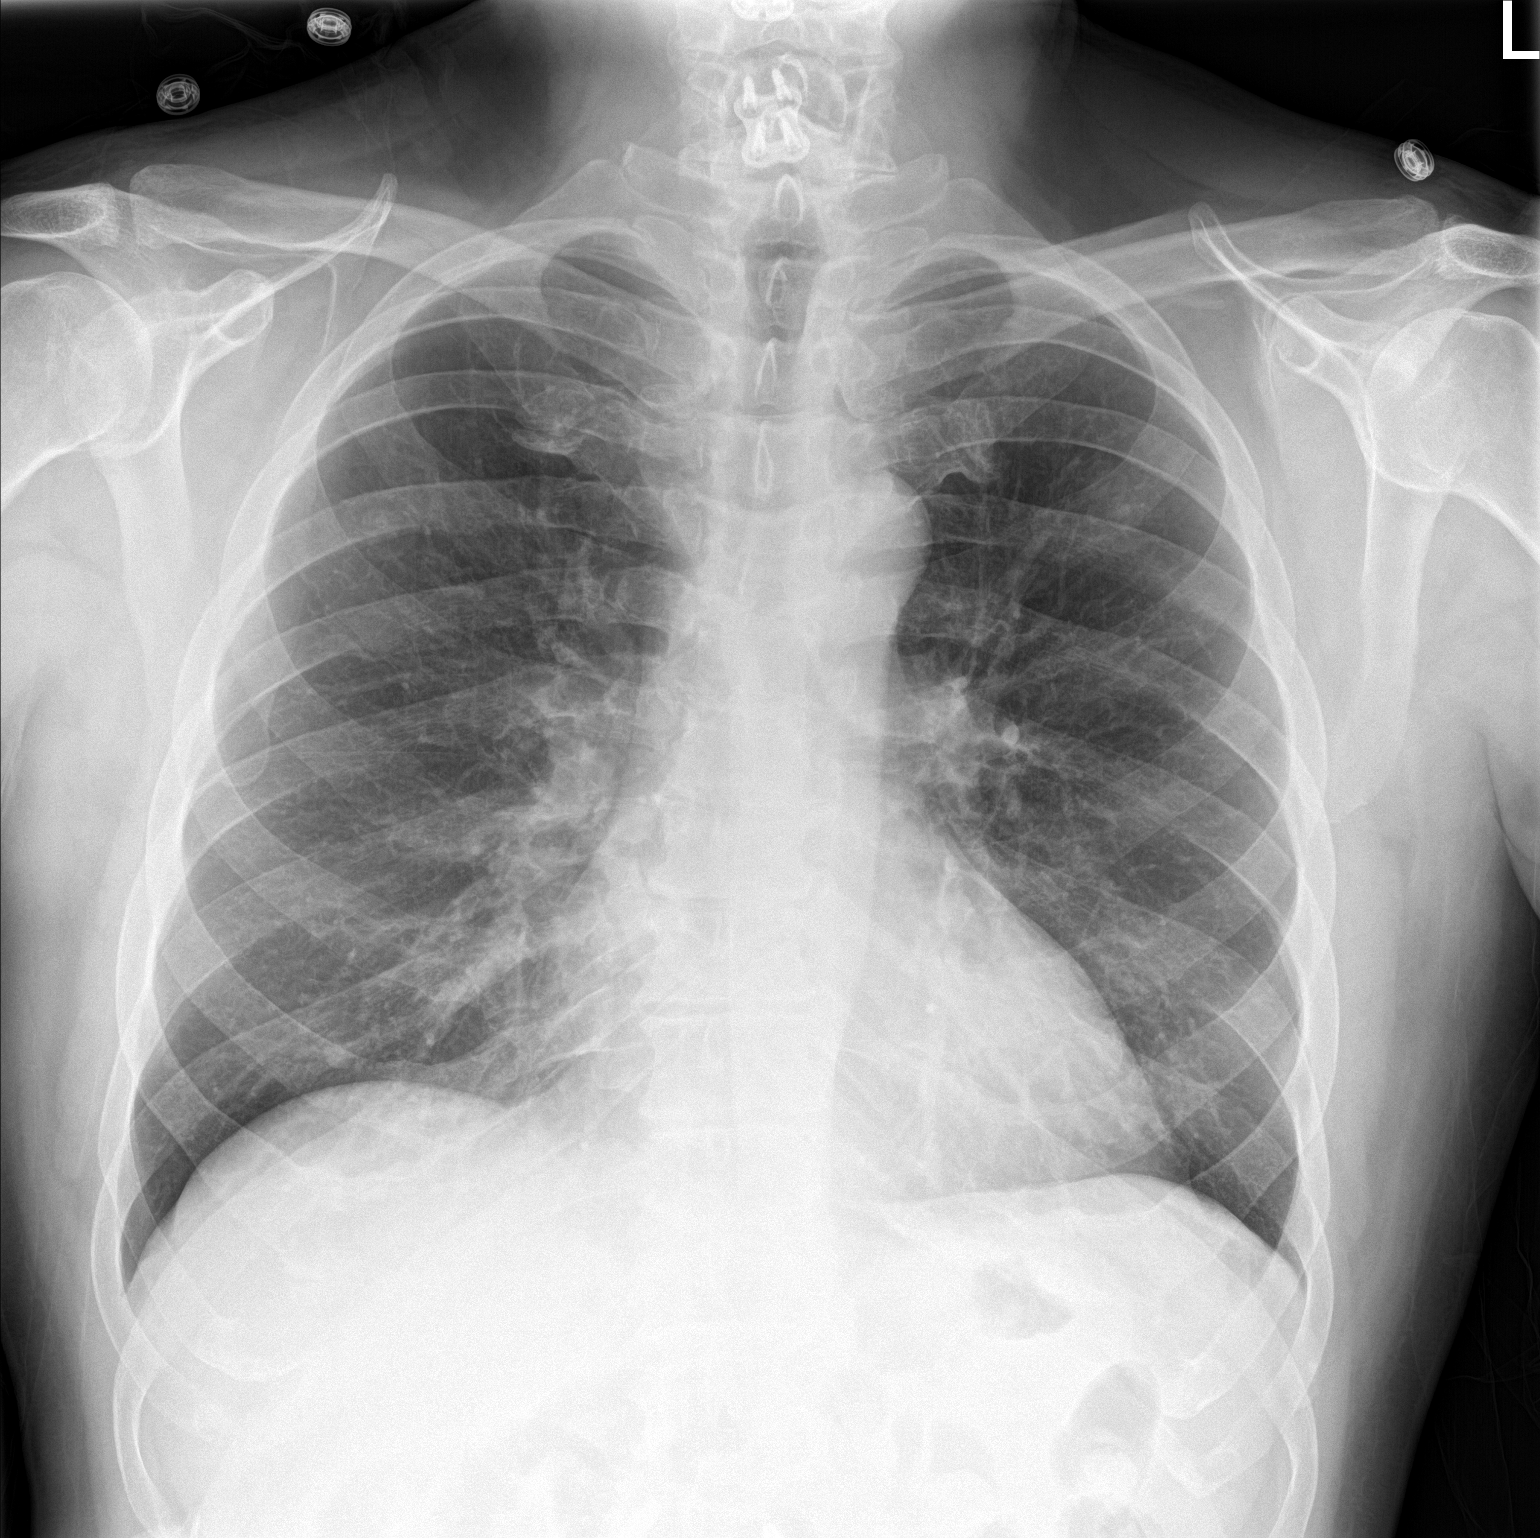

[chest lat]
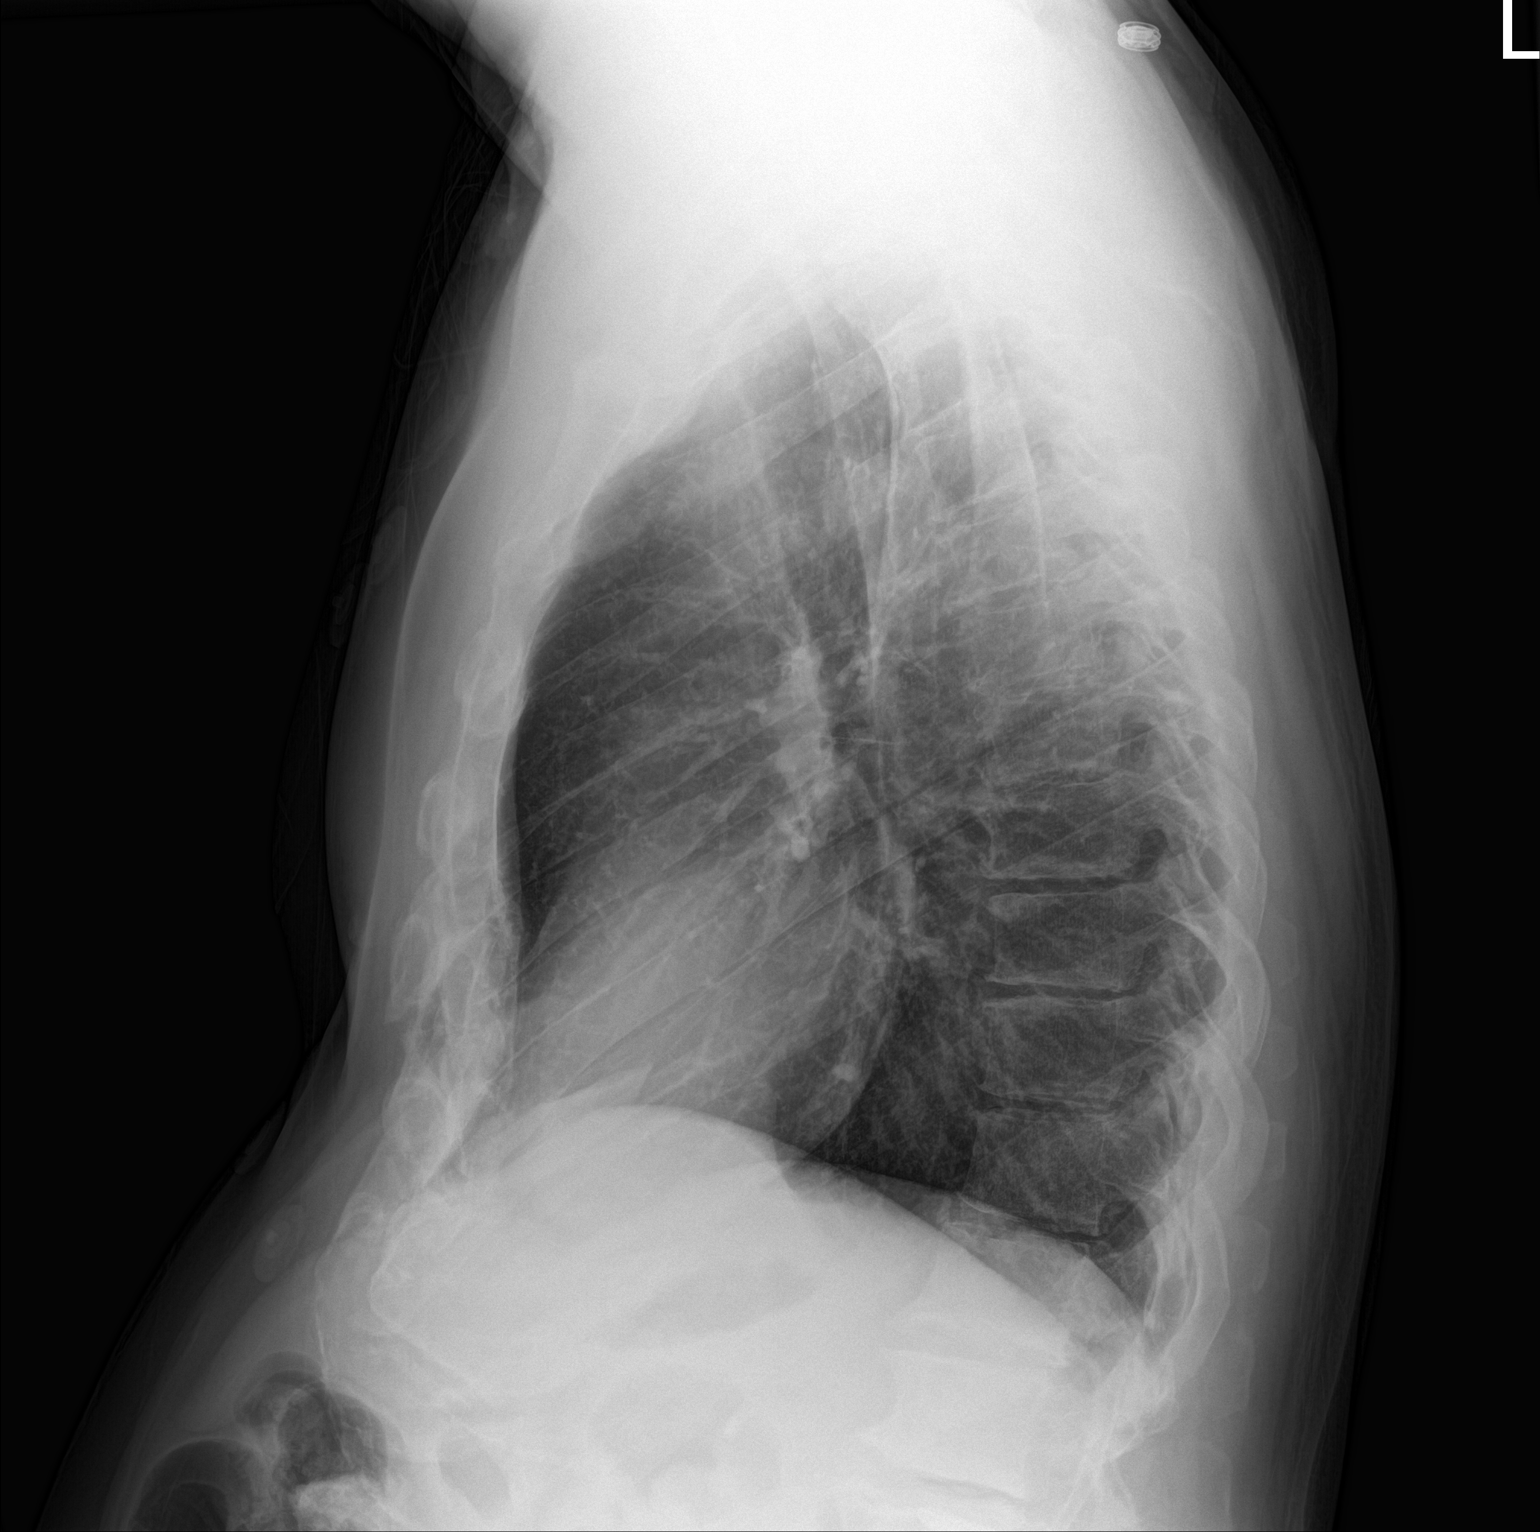

[2 of 2 positions shown; findings below may reference images not displayed]

FINDINGS: The lungs are adequately inflated. There is no focal infiltrate. The
interstitial markings are coarse but stable and likely reflect the
patient's smoking history. There is a stable sub cm calcified nodule
in the right lower lobe anteriorly. The heart and pulmonary
vascularity are normal. The mediastinum is normal in width. The bony
thorax exhibits no acute abnormality.
IMPRESSION: Evidence of previous granulomatous infection, stable. There is no
active cardiopulmonary disease.

## 2016-04-11 MED FILL — tiZANidine HCL 4 MG TABS: 4 | 30 days supply | Qty: 90 | Fill #1

## 2016-04-11 MED FILL — oxyCODONE HCL 10 MG TABS: 10 | 30 days supply | Qty: 120 | Fill #0

## 2016-04-24 DIAGNOSIS — J209 Acute bronchitis, unspecified: Secondary | ICD-10-CM | POA: Diagnosis not present

## 2016-04-24 MED FILL — predniSONE 10 MG TABS: 10 | 6 days supply | Qty: 21 | Fill #0

## 2016-04-24 MED FILL — AZITHROMYCIN 250 MG TABLET: 250 | 5 days supply | Qty: 6 | Fill #0

## 2016-04-25 MED FILL — VENTOLIN HFA 90 MCG INHALER: 108 (90 BAS | 16 days supply | Qty: 18 | Fill #0

## 2016-05-03 DIAGNOSIS — E7800 Pure hypercholesterolemia, unspecified: Secondary | ICD-10-CM | POA: Diagnosis not present

## 2016-05-03 DIAGNOSIS — R05 Cough: Secondary | ICD-10-CM | POA: Diagnosis not present

## 2016-05-03 DIAGNOSIS — R739 Hyperglycemia, unspecified: Secondary | ICD-10-CM | POA: Diagnosis not present

## 2016-05-03 DIAGNOSIS — F17200 Nicotine dependence, unspecified, uncomplicated: Secondary | ICD-10-CM | POA: Diagnosis not present

## 2016-05-03 DIAGNOSIS — R062 Wheezing: Secondary | ICD-10-CM | POA: Diagnosis not present

## 2016-05-03 MED FILL — AZITHROMYCIN 250 MG TABLET: 250 | 5 days supply | Qty: 6 | Fill #0

## 2016-05-24 DIAGNOSIS — M545 Low back pain: Secondary | ICD-10-CM | POA: Diagnosis not present

## 2016-05-24 DIAGNOSIS — M542 Cervicalgia: Secondary | ICD-10-CM | POA: Diagnosis not present

## 2016-06-04 DIAGNOSIS — R739 Hyperglycemia, unspecified: Secondary | ICD-10-CM | POA: Diagnosis not present

## 2016-06-04 DIAGNOSIS — Z6830 Body mass index (BMI) 30.0-30.9, adult: Secondary | ICD-10-CM | POA: Diagnosis not present

## 2016-06-04 DIAGNOSIS — E7800 Pure hypercholesterolemia, unspecified: Secondary | ICD-10-CM | POA: Diagnosis not present

## 2016-06-04 DIAGNOSIS — E669 Obesity, unspecified: Secondary | ICD-10-CM | POA: Diagnosis not present

## 2016-06-04 DIAGNOSIS — E875 Hyperkalemia: Secondary | ICD-10-CM | POA: Diagnosis not present

## 2016-06-04 DIAGNOSIS — R05 Cough: Secondary | ICD-10-CM | POA: Diagnosis not present

## 2016-06-04 DIAGNOSIS — F17200 Nicotine dependence, unspecified, uncomplicated: Secondary | ICD-10-CM | POA: Diagnosis not present

## 2016-06-04 DIAGNOSIS — R5383 Other fatigue: Secondary | ICD-10-CM | POA: Diagnosis not present

## 2016-06-04 DIAGNOSIS — R062 Wheezing: Secondary | ICD-10-CM | POA: Diagnosis not present

## 2016-06-04 MED FILL — VENTOLIN HFA 90 MCG INHALER: 108 (90 BAS | 25 days supply | Qty: 18 | Fill #0

## 2016-06-04 MED FILL — AZITHROMYCIN 250 MG TABLET: 250 | 3 days supply | Qty: 6 | Fill #0

## 2016-06-04 MED FILL — tiZANidine HCL 4 MG TABS: 4 | 30 days supply | Qty: 90 | Fill #0

## 2016-06-06 DIAGNOSIS — R946 Abnormal results of thyroid function studies: Secondary | ICD-10-CM | POA: Diagnosis not present

## 2016-06-11 ENCOUNTER — Ambulatory Visit (INDEPENDENT_AMBULATORY_CARE_PROVIDER_SITE_OTHER): Payer: 59 | Admitting: Internal Medicine

## 2016-06-11 ENCOUNTER — Encounter: Payer: Self-pay | Admitting: Internal Medicine

## 2016-06-11 VITALS — BP 128/74 | HR 84 | Ht 69.5 in | Wt 199.0 lb

## 2016-06-11 DIAGNOSIS — E0580 Other thyrotoxicosis without thyrotoxic crisis or storm: Secondary | ICD-10-CM | POA: Diagnosis not present

## 2016-06-11 DIAGNOSIS — E0590 Thyrotoxicosis, unspecified without thyrotoxic crisis or storm: Secondary | ICD-10-CM

## 2016-06-11 NOTE — Patient Instructions (Addendum)
Please stop the iodine supplement.  Please come back for labs in 4 weeks.   Stop the vitamins 3 days before the tests and restart after the tests.  Please come back for a follow-up appointment in 3 months.   Hyperthyroidism Hyperthyroidism is when the thyroid is too active (overactive). Your thyroid is a large gland that is located in your neck. The thyroid helps to control how your body uses food (metabolism). When your thyroid is overactive, it produces too much of a hormone called thyroxine.  CAUSES Causes of hyperthyroidism may include:  Graves disease. This is when your immune system attacks the thyroid gland. This is the most common cause.  Inflammation of the thyroid gland.  Tumor in the thyroid gland or somewhere else.  Excessive use of thyroid medicines, including:  Prescription thyroid supplement.  Herbal supplements that mimic thyroid hormones.  Solid or fluid-filled lumps within your thyroid gland (thyroid nodules).  Excessive ingestion of iodine. RISK FACTORS  Being male.  Having a family history of thyroid conditions. SIGNS AND SYMPTOMS Signs and symptoms of hyperthyroidism may include:  Nervousness.  Inability to tolerate heat.  Unexplained weight loss.  Diarrhea.  Change in the texture of hair or skin.  Heart skipping beats or making extra beats.  Rapid heart rate.  Loss of menstruation.  Shaky hands.  Fatigue.  Restlessness.  Increased appetite.  Sleep problems.  Enlarged thyroid gland or nodules. DIAGNOSIS  Diagnosis of hyperthyroidism may include:  Medical history and physical exam.  Blood tests.  Ultrasound tests. TREATMENT Treatment may include:  Medicines to control your thyroid.  Surgery to remove your thyroid.  Radiation therapy. HOME CARE INSTRUCTIONS   Take medicines only as directed by your health care provider.  Do not use any tobacco products, including cigarettes, chewing tobacco, or electronic  cigarettes. If you need help quitting, ask your health care provider.  Do not exercise or do physical activity until your health care provider approves.  Keep all follow-up appointments as directed by your health care provider. This is important. SEEK MEDICAL CARE IF:  Your symptoms do not get better with treatment.  You have fever.  You are taking thyroid replacement medicine and you:  Have depression.  Feel mentally and physically slow.  Have weight gain. SEEK IMMEDIATE MEDICAL CARE IF:   You have decreased alertness or a change in your awareness.  You have abdominal pain.  You feel dizzy.  You have a rapid heartbeat.  You have an irregular heartbeat.   This information is not intended to replace advice given to you by your health care provider. Make sure you discuss any questions you have with your health care provider.   Document Released: 09/23/2005 Document Revised: 10/14/2014 Document Reviewed: 02/08/2014 Elsevier Interactive Patient Education [employer].

## 2016-06-11 NOTE — Progress Notes (Signed)
Patient ID: Billy Alexander, male   DOB: June 16, 1961, 55 y.o.   MRN: [id number]    HPI  Billy Alexander is a 55 y.o.-year-old male, referred by his PCP, Dr. Jacelyn Grip for evaluation and management for subclinical thyrotoxicosis.  I reviewed pt's thyroid tests: 06/06/2016: TSH was low: 0.338, TT4 8.9 (4.5-12) 06/04/2016: TSH was low: 0.33 Lab Results  Component Value Date   TSH 0.194 (L) 07/05/2015   Pt denies feeling nodules in neck, hoarseness, dysphagia/odynophagia, SOB with lying down; he c/o: - + fatigue - + Slightly more sweating/heat intolerance this summer - no tremors - no anxiety - no palpitations - no hyperdefecation - no weight loss - no hair loss  Pt does not have a FH of thyroid ds. No FH of thyroid cancer. No h/o radiation tx to head or neck.  No seaweed or kelp, no recent contrast studies. + steroid use 1 mo ago (5 days). He is on [employer] (as he is prediabetic), Weight loss meds (vinegar, vitamins). + Biotin use - in [place] - took it this am. On Oxycodone.  He just started iodine drops 4 days ago!  ROS: Constitutional: + See history of present illness, + poor sleep, + nocturia Eyes: no blurry vision, no xerophthalmia ENT: no sore throat, no nodules palpated in throat, no dysphagia/odynophagia, no hoarseness Cardiovascular: no CP/palpitations/leg swelling Respiratory: no cough/+ SOB Gastrointestinal: no N/V/D/C Musculoskeletal: + Both: muscle/joint aches Skin: no rashes Neurological: no tremors/numbness/tingling/dizziness Psychiatric: no depression/anxiety + Low libido  Past Medical History:  Diagnosis Date  . 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 CERVICAL SPINE SURGERY  01/2014 and 2007  . EYE SURGERY Bilateral 2011   Lasik  . NEPHRECTOMY Left 2007   Social History   Social History  . Marital status: Married    Spouse name: N/A  . Number of children: 1   Occupational History  .  Self-employed    Social History Main Topics  . Smoking status: Current Every Day Smoker    Packs/day: 1.00    Types: Cigarettes    Start date: 07/12/1974  . Smokeless tobacco: Never Used  . Alcohol use No  . Drug use: No   Current Outpatient Prescriptions on File Prior to Visit  Medication Sig Dispense Refill  . Artificial Tear Ointment (ARTIFICIAL TEARS) ointment Place 1 drop into both eyes as needed (dry eyes).    . fluticasone (FLONASE) 50 MCG/ACT nasal spray PLACE 2 SPRAYS INTO BOTH NOSTRILS DAILY 16 g 5  . aspirin EC 81 MG tablet Take 1 tablet (81 mg total) by mouth daily. (Patient not taking: Reported on 06/11/2016) 30 tablet 2  . atorvastatin (LIPITOR) 80 MG tablet Take 1 tablet (80 mg total) by mouth daily. (Patient not taking: Reported on 06/11/2016) 30 tablet 2  . HYDROcodone-acetaminophen (NORCO) 10-325 MG tablet Take 1 tablet by mouth every 6 (six) hours as needed for severe pain.   0  . oxyCODONE (ROXICODONE) 15 MG immediate release tablet Take 1 tablet (15 mg total) by mouth every 4 (four) hours as needed for severe pain. (Patient not taking: Reported on 07/05/2015) 80 tablet 0  . tiZANidine (ZANAFLEX) 4 MG tablet Take 4 mg by mouth 3 (three) times daily as needed for muscle spasms.   2   No current facility-administered medications on file prior to visit.    No Known Allergies  Family history: No pertinent family history + Mother has DM  PE: BP 128/74 (BP Location: Left Arm, Patient Position: Sitting)   Pulse 84   Ht 5' 9.5" (1.765 m)   Wt 199 lb (90.3 kg)   SpO2 94%   BMI 28.97 kg/m  Wt Readings from Last 3 Encounters:  06/11/16 199 lb (90.3 kg)  07/05/15 196 lb 13.9 oz (89.3 kg)  07/12/14 200 lb (90.7 kg)   Constitutional: overweight, in NAD Eyes: PERRLA, EOMI, no exophthalmos, no lid lag, no stare ENT: moist mucous membranes, no thyromegaly, no thyroid bruits, no cervical lymphadenopathy Cardiovascular: RRR, No MRG Respiratory: CTA B Gastrointestinal: abdomen  soft, NT, ND, BS+ Musculoskeletal: no deformities, strength intact in all 4 Skin: moist, warm, no rashes Neurological: no tremor with outstretched hands, DTR normal in all 4  ASSESSMENT: 1. Subclinical Thyrotoxicosis  PLAN:  1. Patient with a recently found low TSH (however, per review of the chart, he had a low TSH one year ago, also), without thyrotoxic sxs other than possibly fatigue:  no weight loss, heat intolerance, hyperdefecation, palpitations, anxiety.  -  we discussed that the low TSH could be secondary to exogenous causes: Iodine use (I advised him to stop the supplements right away), biotin (he is on vitamins for men including biotin - I advised him to stop these 3 weeks prior to next lab draw), Opiates (I advised him not to use his pain medicine in the morning of the next lab draw). He also had a course of prednisone in the previous month, but I doubt this could have influenced last TFT results. - We discussed that possible  intrinsic causes of thyrotoxicosis are:  Graves ds   Thyroiditis toxic multinodular goiter/ toxic adenoma (I cannot feel nodules at palpation of his thyroid). - I suggested that we check the TSH, fT3 and fT4  in 4 weeks, after he stops his iodine supplements, and holds his biotin and his opiates) and also add thyroid stimulating antibodies to screen for Graves' disease.  - If the tests remain abnormal, we may need an uptake and scan to differentiate between the 3 above possible etiologies  - possible modalities of treatment for the above conditions include methimazole use, radioactive iodine ablation or (last resort) surgery.However, we discussed that his thyroid abnormalities very mild, and if this is not worsening, no treatment is needed. - we might need to do thyroid ultrasound depending on the results of the uptake and scan (if a cold nodule is present) - I do not feel that we need to add beta blockers at this time, since he is not tachycardic, anxious, or  tremulous - I advised him to join my chart to communicate easier - RTC in 3 months, but  sooner for repeat labs  Orders Placed This Encounter  Procedures  . T3, free  . T4, free  . TSH  . Thyroid Stimulating Immunoglobulin   Philemon Kingdom, MD PhD [facility] Endocrinology

## 2016-06-13 ENCOUNTER — Encounter: Payer: Self-pay | Admitting: Urgent Care

## 2016-06-13 ENCOUNTER — Ambulatory Visit (INDEPENDENT_AMBULATORY_CARE_PROVIDER_SITE_OTHER): Payer: Self-pay | Admitting: Urgent Care

## 2016-06-13 VITALS — BP 124/82 | HR 73 | Temp 97.4°F | Resp 17 | Ht 69.5 in | Wt 206.0 lb

## 2016-06-13 DIAGNOSIS — Z021 Encounter for pre-employment examination: Secondary | ICD-10-CM

## 2016-06-13 DIAGNOSIS — G8929 Other chronic pain: Secondary | ICD-10-CM

## 2016-06-13 DIAGNOSIS — Z9889 Other specified postprocedural states: Secondary | ICD-10-CM

## 2016-06-13 DIAGNOSIS — M542 Cervicalgia: Secondary | ICD-10-CM

## 2016-06-13 DIAGNOSIS — Z024 Encounter for examination for driving license: Secondary | ICD-10-CM

## 2016-06-13 NOTE — Progress Notes (Signed)
Commercial Driver Medical Examination   Billy Alexander is a 55 y.o. male who presents today for a DOT physical exam. The patient reports a history of chronic neck pain s/p surgery. He is followed by [facility] and is seen once every 3 months. His pain is managed with oxycodone, Zanaflex. He states that he uses oxycodone approximately twice per week, has occasional use of Zanaflex. Smokes 1ppd, does not use alcohol.  The following portions of the patient's history were reviewed and updated as appropriate: allergies, current medications, past family history, past medical history, past social history and past surgical history.  Objective:   BP 124/82 (BP Location: Right Arm, Patient Position: Sitting, Cuff Size: Large)   Pulse 73   Temp 97.4 F (36.3 C) (Oral)   Resp 17   Ht 5' 9.5" (1.765 m)   Wt 206 lb (93.4 kg)   SpO2 99%   BMI 29.98 kg/m   Vision/hearing:  Visual Acuity Screening   Right eye Left eye Both eyes  Without correction: 20 15 20 20 20 15   With correction:     Comments: Peripheral Vision: Right eye 70 degrees. Left eye 70 degrees.  The patient can distinguish the colors red, amber and green.  Hearing Screening Comments: The patient was able to hear a forced whisper from 10 feet.  Patient can recognize and distinguish among traffic control signals and devices showing standard red, green, and amber colors.  Corrective lenses required: No  Monocular Vision?: No  Hearing aid requirement: No  Physical Exam  Constitutional: He is oriented to person, place, and time. He appears well-developed and well-nourished.  HENT:  TM's intact bilaterally, no effusions or erythema. Nasal turbinates pink and moist, nasal passages patent. No sinus tenderness. Oropharynx clear, mucous membranes moist, dentition in good repair.  Eyes: Conjunctivae and EOM are normal. Pupils are equal, round, and reactive to light. Right eye exhibits no discharge. Left eye exhibits no  discharge. No scleral icterus.  Neck: Normal range of motion. Neck supple. No thyromegaly present.  Cardiovascular: Normal rate, regular rhythm and intact distal pulses.  Exam reveals no gallop and no friction rub.   No murmur heard. Pulmonary/Chest: No stridor. No respiratory distress. He has no wheezes. He has no rales.  Abdominal: Soft. Bowel sounds are normal. He exhibits no distension and no mass. There is no tenderness.  Musculoskeletal: Normal range of motion. He exhibits no edema or tenderness.  Lymphadenopathy:    He has no cervical adenopathy.  Neurological: He is alert and oriented to person, place, and time. He has normal reflexes.  Skin: Skin is warm and dry. No rash noted. No erythema. No pallor.  Psychiatric: He has a normal mood and affect.   Labs: Comments: SG: 1.030, PRO: Trace, BLO: Neg, GLU: Neg  Assessment:    Healthy male exam.  Meets standards, but periodic monitoring required due to chronic neck pain, history of neck surgery, opioid use.  Driver qualified only for 1 year.    Plan:   Medical examiners certificate completed and printed. Return as needed.

## 2016-06-13 NOTE — Patient Instructions (Addendum)
Keeping you healthy  Get these tests  Blood pressure- Have your blood pressure checked once a year by your healthcare provider.  Normal blood pressure is 120/80  Weight- Have your body mass index (BMI) calculated to screen for obesity.  BMI is a measure of body fat based on height and weight. You can also calculate your own BMI at [email or web address]/.  Cholesterol- Have your cholesterol checked every year.  Diabetes- Have your blood sugar checked regularly if you have high blood pressure, high cholesterol, have a family history of diabetes or if you are overweight.  Screening for Colon Cancer- Colonoscopy starting at age 50.  Screening may begin sooner depending on your family history and other health conditions. Follow up colonoscopy as directed by your Gastroenterologist.  Screening for Prostate Cancer- Both blood work (PSA) and a rectal exam help screen for Prostate Cancer.  Screening begins at age 40 with African-American men and at age 50 with Caucasian men.  Screening may begin sooner depending on your family history.  Take these medicines  Aspirin- One aspirin daily can help prevent Heart disease and Stroke.  Flu shot- Every fall.  Tetanus- Every 10 years.  Zostavax- Once after the age of 60 to prevent Shingles.  Pneumonia shot- Once after the age of 65; if you are younger than 65, ask your healthcare provider if you need a Pneumonia shot.  Take these steps  Don't smoke- If you do smoke, talk to your doctor about quitting.  For tips on how to quit, go to www.smokefree.gov or call 1-800-QUIT-NOW.  Be physically active- Exercise 5 days a week for at least 30 minutes.  If you are not already physically active start slow and gradually work up to 30 minutes of moderate physical activity.  Examples of moderate activity include walking briskly, mowing the yard, dancing, swimming, bicycling, etc.  Eat a healthy diet- Eat a variety of healthy food such as fruits, vegetables, low  fat milk, low fat cheese, yogurt, lean meant, poultry, fish, beans, tofu, etc. For more information go to www.thenutritionsource.org  Drink alcohol in moderation- Limit alcohol intake to less than two drinks a day. Never drink and drive.  Dentist- Brush and floss twice daily; visit your dentist twice a year.  Depression- Your emotional health is as important as your physical health. If you're feeling down, or losing interest in things you would normally enjoy please talk to your healthcare provider.  Eye exam- Visit your eye doctor every year.  Safe sex- If you may be exposed to a sexually transmitted infection, use a condom.  Seat belts- Seat belts can save your life; always wear one.  Smoke/Carbon Monoxide detectors- These detectors need to be installed on the appropriate level of your home.  Replace batteries at least once a year.  Skin cancer- When out in the sun, cover up and use sunscreen 15 SPF or higher.  Violence- If anyone is threatening you, please tell your healthcare provider.  Living Will/ Health care power of attorney- Speak with your healthcare provider and family.    IF you received an x-ray today, you will receive an invoice from [facility]. Please contact [facility] at [phone] with questions or concerns regarding your invoice.   IF you received labwork today, you will receive an invoice from [facility]. Please contact [facility] at [phone] with questions or concerns regarding your invoice.   Our billing staff will not be able to assist you with questions regarding bills from these companies.    You will be contacted with the lab results as soon as they are available. The fastest way to get your results is to activate your My Chart account. Instructions are located on the last page of this paperwork. If you have not heard from us regarding the results in 2 weeks, please contact this office.      

## 2016-07-11 ENCOUNTER — Telehealth: Payer: Self-pay

## 2016-07-11 ENCOUNTER — Other Ambulatory Visit (INDEPENDENT_AMBULATORY_CARE_PROVIDER_SITE_OTHER): Payer: 59

## 2016-07-11 DIAGNOSIS — E0580 Other thyrotoxicosis without thyrotoxic crisis or storm: Secondary | ICD-10-CM | POA: Diagnosis not present

## 2016-07-11 LAB — TSH: TSH: 0.51 u[IU]/mL (ref 0.35–4.50)

## 2016-07-11 LAB — T3, FREE: T3 FREE: 3.9 pg/mL (ref 2.3–4.2)

## 2016-07-11 LAB — T4, FREE: Free T4: 1.03 ng/dL (ref 0.60–1.60)

## 2016-07-11 NOTE — Telephone Encounter (Signed)
Called patient and gave lab results. Patient had no questions or concerns.  

## 2016-07-16 LAB — THYROID STIMULATING IMMUNOGLOBULIN

## 2016-07-31 DIAGNOSIS — M545 Low back pain: Secondary | ICD-10-CM | POA: Diagnosis not present

## 2016-07-31 DIAGNOSIS — R030 Elevated blood-pressure reading, without diagnosis of hypertension: Secondary | ICD-10-CM | POA: Diagnosis not present

## 2016-07-31 DIAGNOSIS — Z6828 Body mass index (BMI) 28.0-28.9, adult: Secondary | ICD-10-CM | POA: Diagnosis not present

## 2016-07-31 DIAGNOSIS — M542 Cervicalgia: Secondary | ICD-10-CM | POA: Diagnosis not present

## 2016-10-01 DIAGNOSIS — F17200 Nicotine dependence, unspecified, uncomplicated: Secondary | ICD-10-CM | POA: Diagnosis not present

## 2016-10-01 DIAGNOSIS — R05 Cough: Secondary | ICD-10-CM | POA: Diagnosis not present

## 2016-10-01 DIAGNOSIS — R946 Abnormal results of thyroid function studies: Secondary | ICD-10-CM | POA: Diagnosis not present

## 2016-10-01 MED FILL — AZITHROMYCIN 500 MG TABLET: 500 | 3 days supply | Qty: 3 | Fill #0

## 2016-10-01 MED FILL — VENTOLIN HFA 90 MCG INHALER: 108 (90 BAS | 25 days supply | Qty: 18 | Fill #0

## 2016-10-30 DIAGNOSIS — Z6829 Body mass index (BMI) 29.0-29.9, adult: Secondary | ICD-10-CM | POA: Diagnosis not present

## 2016-10-30 DIAGNOSIS — R030 Elevated blood-pressure reading, without diagnosis of hypertension: Secondary | ICD-10-CM | POA: Diagnosis not present

## 2016-10-30 DIAGNOSIS — F1190 Opioid use, unspecified, uncomplicated: Secondary | ICD-10-CM | POA: Diagnosis not present

## 2016-10-30 DIAGNOSIS — M545 Low back pain: Secondary | ICD-10-CM | POA: Diagnosis not present

## 2016-11-05 MED FILL — oxyCODONE HCL 10 MG TABS: 10 | 30 days supply | Qty: 120 | Fill #0

## 2016-12-05 MED FILL — HYDROCODON-APAP 10-325: 10-325 | 30 days supply | Qty: 120 | Fill #0

## 2016-12-05 MED FILL — tiZANidine HCL 4 MG TABS: 4 | 30 days supply | Qty: 90 | Fill #0

## 2017-01-06 MED FILL — tiZANidine HCL 4 MG TABS: 4 | 30 days supply | Qty: 90 | Fill #0

## 2017-02-03 MED FILL — HYDROCODON-APAP 10-325: 10-325 | 30 days supply | Qty: 120 | Fill #0

## 2017-02-03 MED FILL — tiZANidine HCL 4 MG TABS: 4 | 30 days supply | Qty: 90 | Fill #1

## 2017-02-11 DIAGNOSIS — E7800 Pure hypercholesterolemia, unspecified: Secondary | ICD-10-CM | POA: Diagnosis not present

## 2017-02-11 DIAGNOSIS — F17200 Nicotine dependence, unspecified, uncomplicated: Secondary | ICD-10-CM | POA: Diagnosis not present

## 2017-02-11 DIAGNOSIS — R030 Elevated blood-pressure reading, without diagnosis of hypertension: Secondary | ICD-10-CM | POA: Diagnosis not present

## 2017-02-11 DIAGNOSIS — R062 Wheezing: Secondary | ICD-10-CM | POA: Diagnosis not present

## 2017-02-11 DIAGNOSIS — B351 Tinea unguium: Secondary | ICD-10-CM | POA: Diagnosis not present

## 2017-02-11 DIAGNOSIS — R05 Cough: Secondary | ICD-10-CM | POA: Diagnosis not present

## 2017-02-11 DIAGNOSIS — R739 Hyperglycemia, unspecified: Secondary | ICD-10-CM | POA: Diagnosis not present

## 2017-02-19 DIAGNOSIS — M545 Low back pain: Secondary | ICD-10-CM | POA: Diagnosis not present

## 2017-02-19 DIAGNOSIS — Z6828 Body mass index (BMI) 28.0-28.9, adult: Secondary | ICD-10-CM | POA: Diagnosis not present

## 2017-02-19 DIAGNOSIS — M542 Cervicalgia: Secondary | ICD-10-CM | POA: Diagnosis not present

## 2017-03-04 MED FILL — oxyCODONE HCL 10 MG TABS: 10 | 30 days supply | Qty: 120 | Fill #0

## 2017-03-04 MED FILL — tiZANidine HCL 4 MG TABS: 4 | 30 days supply | Qty: 90 | Fill #2

## 2017-03-05 DIAGNOSIS — B338 Other specified viral diseases: Secondary | ICD-10-CM | POA: Diagnosis not present

## 2017-03-05 DIAGNOSIS — J208 Acute bronchitis due to other specified organisms: Secondary | ICD-10-CM | POA: Diagnosis not present

## 2017-03-05 MED FILL — BROMIPHENIR-PSEUDOEPHED-DM: 30-2-10 | 4 days supply | Qty: 180 | Fill #0

## 2017-03-05 MED FILL — levoFLOXacin 500 MG TABS: 500 | 10 days supply | Qty: 10 | Fill #0

## 2017-03-05 MED FILL — IPRATROPIUM 0.06% SPRAY: 0.06 | 30 days supply | Qty: 15 | Fill #0

## 2017-03-05 MED FILL — VENTOLIN HFA 90 MCG INHALER: 108 (90 BAS | 25 days supply | Qty: 18 | Fill #0

## 2017-03-11 DIAGNOSIS — J209 Acute bronchitis, unspecified: Secondary | ICD-10-CM | POA: Diagnosis not present

## 2017-03-11 MED FILL — BROMIPHENIR-PSEUDOEPHED-DM: 30-2-10 | 4 days supply | Qty: 180 | Fill #0

## 2017-03-11 MED FILL — AZITHROMYCIN 250 MG TAB: 250 | 5 days supply | Qty: 6 | Fill #0

## 2017-04-02 MED FILL — oxyCODONE HCL 10 MG TABS: 10 | 30 days supply | Qty: 120 | Fill #0

## 2017-04-17 MED FILL — tiZANidine HCL 4 MG TABS: 4 | 30 days supply | Qty: 90 | Fill #0

## 2017-05-07 MED FILL — IPRATROPIUM 0.06% SPRAY: 0.06 | 30 days supply | Qty: 15 | Fill #0

## 2017-05-16 MED FILL — tiZANidine HCL 4 MG TABS: 4 | 30 days supply | Qty: 90 | Fill #1

## 2017-05-19 DIAGNOSIS — R7303 Prediabetes: Secondary | ICD-10-CM | POA: Diagnosis not present

## 2017-05-19 DIAGNOSIS — R030 Elevated blood-pressure reading, without diagnosis of hypertension: Secondary | ICD-10-CM | POA: Diagnosis not present

## 2017-05-19 DIAGNOSIS — Z125 Encounter for screening for malignant neoplasm of prostate: Secondary | ICD-10-CM | POA: Diagnosis not present

## 2017-05-19 DIAGNOSIS — F17210 Nicotine dependence, cigarettes, uncomplicated: Secondary | ICD-10-CM | POA: Diagnosis not present

## 2017-05-19 DIAGNOSIS — F17200 Nicotine dependence, unspecified, uncomplicated: Secondary | ICD-10-CM | POA: Diagnosis not present

## 2017-05-19 DIAGNOSIS — E7800 Pure hypercholesterolemia, unspecified: Secondary | ICD-10-CM | POA: Diagnosis not present

## 2017-05-19 DIAGNOSIS — E0590 Thyrotoxicosis, unspecified without thyrotoxic crisis or storm: Secondary | ICD-10-CM | POA: Diagnosis not present

## 2017-05-19 DIAGNOSIS — Z1211 Encounter for screening for malignant neoplasm of colon: Secondary | ICD-10-CM | POA: Diagnosis not present

## 2017-05-21 MED FILL — ATORVASTATIN 20 MG TABLET: 20 | 90 days supply | Qty: 90 | Fill #0

## 2017-05-29 DIAGNOSIS — Z6829 Body mass index (BMI) 29.0-29.9, adult: Secondary | ICD-10-CM | POA: Diagnosis not present

## 2017-05-29 DIAGNOSIS — R030 Elevated blood-pressure reading, without diagnosis of hypertension: Secondary | ICD-10-CM | POA: Diagnosis not present

## 2017-05-29 DIAGNOSIS — F1190 Opioid use, unspecified, uncomplicated: Secondary | ICD-10-CM | POA: Diagnosis not present

## 2017-05-29 DIAGNOSIS — M545 Low back pain: Secondary | ICD-10-CM | POA: Diagnosis not present

## 2017-05-29 DIAGNOSIS — M542 Cervicalgia: Secondary | ICD-10-CM | POA: Diagnosis not present

## 2017-05-29 MED FILL — MORPHINE SULFATE IR 15 MG T: 15 | 30 days supply | Qty: 120 | Fill #0

## 2017-06-20 MED FILL — tiZANidine HCL 4 MG TABS: 4 | 30 days supply | Qty: 90 | Fill #2

## 2017-07-29 MED FILL — tiZANidine HCL 4 MG TABS: 4 | 30 days supply | Qty: 90 | Fill #0

## 2017-08-21 DIAGNOSIS — M542 Cervicalgia: Secondary | ICD-10-CM | POA: Diagnosis not present

## 2017-08-21 DIAGNOSIS — R030 Elevated blood-pressure reading, without diagnosis of hypertension: Secondary | ICD-10-CM | POA: Diagnosis not present

## 2017-08-21 DIAGNOSIS — M545 Low back pain: Secondary | ICD-10-CM | POA: Diagnosis not present

## 2017-08-21 MED FILL — oxyCODONE HCL 10 MG TABS: 10 | 30 days supply | Qty: 120 | Fill #0

## 2017-08-24 DIAGNOSIS — F17210 Nicotine dependence, cigarettes, uncomplicated: Secondary | ICD-10-CM | POA: Diagnosis not present

## 2017-08-24 DIAGNOSIS — T1511XA Foreign body in conjunctival sac, right eye, initial encounter: Secondary | ICD-10-CM | POA: Diagnosis not present

## 2017-08-24 DIAGNOSIS — T1592XA Foreign body on external eye, part unspecified, left eye, initial encounter: Secondary | ICD-10-CM | POA: Diagnosis not present

## 2017-08-24 DIAGNOSIS — X58XXXA Exposure to other specified factors, initial encounter: Secondary | ICD-10-CM | POA: Diagnosis not present

## 2017-08-27 DIAGNOSIS — J208 Acute bronchitis due to other specified organisms: Secondary | ICD-10-CM | POA: Diagnosis not present

## 2017-08-27 DIAGNOSIS — J069 Acute upper respiratory infection, unspecified: Secondary | ICD-10-CM | POA: Diagnosis not present

## 2017-10-01 MED FILL — tiZANidine HCL 4 MG TABS: 4 | 30 days supply | Qty: 90 | Fill #1

## 2017-10-02 DIAGNOSIS — E0590 Thyrotoxicosis, unspecified without thyrotoxic crisis or storm: Secondary | ICD-10-CM | POA: Diagnosis not present

## 2017-10-02 DIAGNOSIS — F17210 Nicotine dependence, cigarettes, uncomplicated: Secondary | ICD-10-CM | POA: Diagnosis not present

## 2017-10-02 DIAGNOSIS — G8929 Other chronic pain: Secondary | ICD-10-CM | POA: Diagnosis not present

## 2017-10-02 DIAGNOSIS — E7800 Pure hypercholesterolemia, unspecified: Secondary | ICD-10-CM | POA: Diagnosis not present

## 2017-10-02 DIAGNOSIS — M545 Low back pain: Secondary | ICD-10-CM | POA: Diagnosis not present

## 2017-10-02 DIAGNOSIS — R7303 Prediabetes: Secondary | ICD-10-CM | POA: Diagnosis not present

## 2017-10-02 DIAGNOSIS — R030 Elevated blood-pressure reading, without diagnosis of hypertension: Secondary | ICD-10-CM | POA: Diagnosis not present

## 2017-10-02 MED FILL — DICLOFENAC SODIUM 75 MG TAB: 75 | 30 days supply | Qty: 60 | Fill #0

## 2017-10-02 MED FILL — ATORVASTATIN 20 MG TABLET: 20 | 90 days supply | Qty: 90 | Fill #1

## 2017-10-24 MED FILL — IPRATROPIUM 0.03% SPRAY: 0.03 | 43 days supply | Qty: 30 | Fill #0

## 2017-10-26 DIAGNOSIS — K611 Rectal abscess: Secondary | ICD-10-CM | POA: Diagnosis not present

## 2017-11-06 MED FILL — tiZANidine HCL 4 MG TABS: 4 | 30 days supply | Qty: 90 | Fill #2

## 2017-11-21 DIAGNOSIS — M545 Low back pain: Secondary | ICD-10-CM | POA: Diagnosis not present

## 2017-11-21 DIAGNOSIS — M542 Cervicalgia: Secondary | ICD-10-CM | POA: Diagnosis not present

## 2017-11-21 DIAGNOSIS — Z6830 Body mass index (BMI) 30.0-30.9, adult: Secondary | ICD-10-CM | POA: Diagnosis not present

## 2017-11-21 DIAGNOSIS — I10 Essential (primary) hypertension: Secondary | ICD-10-CM | POA: Diagnosis not present

## 2017-11-21 MED FILL — HYDROCODON-APAP 10-325: 10-325 | 30 days supply | Qty: 120 | Fill #0

## 2017-11-27 MED FILL — IPRATROPIUM 0.03% SPRAY: 0.03 | 43 days supply | Qty: 30 | Fill #1

## 2017-12-01 MED FILL — tiZANidine HCL 4 MG TABS: 4 | 30 days supply | Qty: 90 | Fill #0

## 2017-12-18 MED FILL — DICLOFENAC SOD EC 75 MG TAB: 75 | 30 days supply | Qty: 60 | Fill #1

## 2017-12-18 MED FILL — HYDROCODON-APAP 10-325: 10-325 | 30 days supply | Qty: 120 | Fill #0

## 2018-01-14 MED FILL — tiZANidine HCL 4 MG TABS: 4 | 30 days supply | Qty: 90 | Fill #1

## 2018-01-14 MED FILL — IPRATROPIUM 0.03% SPRAY: 0.03 | 43 days supply | Qty: 30 | Fill #2

## 2018-02-09 MED FILL — tiZANidine HCL 4 MG TABS: 4 | 30 days supply | Qty: 90 | Fill #2

## 2018-02-13 MED FILL — MORPHINE SULFATE IR 15 MG T: 15 | 30 days supply | Qty: 120 | Fill #0

## 2018-02-15 MED FILL — IPRATROPIUM 0.03% SPRAY: 0.03 | 43 days supply | Qty: 30 | Fill #0

## 2018-02-24 ENCOUNTER — Encounter: Payer: Self-pay | Admitting: Urgent Care

## 2018-02-24 ENCOUNTER — Ambulatory Visit (INDEPENDENT_AMBULATORY_CARE_PROVIDER_SITE_OTHER): Payer: No Typology Code available for payment source | Admitting: Urgent Care

## 2018-02-24 VITALS — BP 122/80 | HR 76 | Temp 98.2°F | Resp 17 | Ht 70.5 in | Wt 201.0 lb

## 2018-02-24 DIAGNOSIS — R5383 Other fatigue: Secondary | ICD-10-CM

## 2018-02-24 DIAGNOSIS — Z1159 Encounter for screening for other viral diseases: Secondary | ICD-10-CM | POA: Diagnosis not present

## 2018-02-24 DIAGNOSIS — G8929 Other chronic pain: Secondary | ICD-10-CM | POA: Diagnosis not present

## 2018-02-24 DIAGNOSIS — Z0000 Encounter for general adult medical examination without abnormal findings: Secondary | ICD-10-CM

## 2018-02-24 DIAGNOSIS — Z114 Encounter for screening for human immunodeficiency virus [HIV]: Secondary | ICD-10-CM

## 2018-02-24 DIAGNOSIS — Z1329 Encounter for screening for other suspected endocrine disorder: Secondary | ICD-10-CM

## 2018-02-24 DIAGNOSIS — Z13228 Encounter for screening for other metabolic disorders: Secondary | ICD-10-CM

## 2018-02-24 DIAGNOSIS — Z130 Encounter for screening for diseases of the blood and blood-forming organs and certain disorders involving the immune mechanism: Secondary | ICD-10-CM | POA: Diagnosis not present

## 2018-02-24 DIAGNOSIS — Z23 Encounter for immunization: Secondary | ICD-10-CM | POA: Diagnosis not present

## 2018-02-24 DIAGNOSIS — Z13220 Encounter for screening for lipoid disorders: Secondary | ICD-10-CM | POA: Diagnosis not present

## 2018-02-24 DIAGNOSIS — R7989 Other specified abnormal findings of blood chemistry: Secondary | ICD-10-CM

## 2018-02-24 DIAGNOSIS — F17200 Nicotine dependence, unspecified, uncomplicated: Secondary | ICD-10-CM | POA: Diagnosis not present

## 2018-02-24 NOTE — Progress Notes (Signed)
MRN: [id number]  Subjective:   [name] is a 57 y.o. male presenting for annual physical exam as required by his insurance policy.  Patient is not working, is married.  He is sexually active with his wife. Has good relationships at home, has a good support network.  Does smoke 1 pack/day, has 40-pack-year history.  Denies alcohol use.  Medical care team includes: PCP: None established. Vision: No visual deficits. Dental: Gets dental care consistently. Specialists: Chronic pain clinic.   Health Maintenance: Needs to have tdap updated.  Age-appropriate screening performed including HIV and hepatitis C.  Patient declined screening for colon cancer.  Billy Alexander has a current medication list which includes the following prescription(s): morphine and tizanidine. He has No Known Allergies. Billy Alexander  has a past medical history of Cancer ([place]) (2007). Also  has a past surgical history that includes Cervical spine surgery (01/2014 and 2007); Nephrectomy (Left, 2007); and Eye surgery (Bilateral, 2011). His family history includes Diabetes in his father.  ROS  Objective:   Vitals: BP 122/80   Pulse 76   Temp 98.2 F (36.8 C) (Oral)   Resp 17   Ht 5' 10.5" (1.791 m)   Wt 201 lb (91.2 kg)   SpO2 98%   BMI 28.43 kg/m    Visual Acuity Screening   Right eye Left eye Both eyes  Without correction: 20/25 20/25 20/25   With correction:       Physical Exam  Constitutional: He is oriented to person, place, and time. He appears well-developed and well-nourished.  HENT:  TM's intact bilaterally, no effusions or erythema. Nasal turbinates pink and moist, nasal passages patent. No sinus tenderness. Oropharynx clear, mucous membranes moist, dentition in good repair.  Eyes: Pupils are equal, round, and reactive to light. Conjunctivae and EOM are normal. Right eye exhibits no discharge. Left eye exhibits no discharge. No scleral icterus.  Neck: Normal range of motion. Neck supple. No  thyromegaly present.  Cardiovascular: Normal rate, regular rhythm and intact distal pulses. Exam reveals no gallop and no friction rub.  No murmur heard. Pulmonary/Chest: No stridor. No respiratory distress. He has no wheezes. He has no rales.  Abdominal: Soft. Bowel sounds are normal. He exhibits no distension and no mass. There is no tenderness. There is no guarding.  Musculoskeletal: Normal range of motion. He exhibits no edema or tenderness.  Lymphadenopathy:    He has no cervical adenopathy.  Neurological: He is alert and oriented to person, place, and time. He has normal reflexes. He displays normal reflexes. Coordination normal.  Skin: Skin is warm and dry. No rash noted. No erythema. No pallor.  Psychiatric: He has a normal mood and affect.   Assessment and Plan :   Annual physical exam  Screening for cholesterol level - Plan: Lipid panel  Screening for thyroid disorder - Plan: TSH  Screening for deficiency anemia - Plan: CBC  Screening for metabolic disorder - Plan: Comprehensive metabolic panel  Encounter for hepatitis C screening test for low risk patient - Plan: Hepatitis C antibody  Screening for HIV without presence of risk factors - Plan: HIV antibody  Need for Tdap vaccination  Other chronic pain  Tobacco use disorder  Other fatigue - Plan: Testosterone Free, Profile I  Labs pending, patient is medically stable. Discussed healthy lifestyle, diet, exercise, preventative care, vaccinations, and addressed patient's concerns. Patient requested a check of his testosterone level to know if it is low or not.  I counseled that if it does turn  out to be low it could be age-related but would refer him to a urologist for continued management.  Jaynee Eagles, PA-C [facility] [phone] 02/24/2018  8:23 AM

## 2018-02-24 NOTE — Patient Instructions (Addendum)
Health Maintenance, Male A healthy lifestyle and preventive care is important for your health and wellness. Ask your health care provider about what schedule of regular examinations is right for you. What should I know about weight and diet? Eat a Healthy Diet  Eat plenty of vegetables, fruits, whole grains, low-fat dairy products, and lean protein.  Do not eat a lot of foods high in solid fats, added sugars, or salt.  Maintain a Healthy Weight Regular exercise can help you achieve or maintain a healthy weight. You should:  Do at least 150 minutes of exercise each week. The exercise should increase your heart rate and make you sweat (moderate-intensity exercise).  Do strength-training exercises at least twice a week.  Watch Your Levels of Cholesterol and Blood Lipids  Have your blood tested for lipids and cholesterol every 5 years starting at 58 years of age. If you are at high risk for heart disease, you should start having your blood tested when you are 58 years old. You may need to have your cholesterol levels checked more often if: ? Your lipid or cholesterol levels are high. ? You are older than 58 years of age. ? You are at high risk for heart disease.  What should I know about cancer screening? Many types of cancers can be detected early and may often be prevented. Lung Cancer  You should be screened every year for lung cancer if: ? You are a current smoker who has smoked for at least 30 years. ? You are a former smoker who has quit within the past 15 years.  Talk to your health care provider about your screening options, when you should start screening, and how often you should be screened.  Colorectal Cancer  Routine colorectal cancer screening usually begins at 58 years of age and should be repeated every 5-10 years until you are 58 years old. You may need to be screened more often if early forms of precancerous polyps or small growths are found. Your health care provider  may recommend screening at an earlier age if you have risk factors for colon cancer.  Your health care provider may recommend using home test kits to check for hidden blood in the stool.  A small camera at the end of a tube can be used to examine your colon (sigmoidoscopy or colonoscopy). This checks for the earliest forms of colorectal cancer.  Prostate and Testicular Cancer  Depending on your age and overall health, your health care provider may do certain tests to screen for prostate and testicular cancer.  Talk to your health care provider about any symptoms or concerns you have about testicular or prostate cancer.  Skin Cancer  Check your skin from head to toe regularly.  Tell your health care provider about any new moles or changes in moles, especially if: ? There is a change in a mole's size, shape, or color. ? You have a mole that is larger than a pencil eraser.  Always use sunscreen. Apply sunscreen liberally and repeat throughout the day.  Protect yourself by wearing long sleeves, pants, a wide-brimmed hat, and sunglasses when outside.  What should I know about heart disease, diabetes, and high blood pressure?  If you are 18-39 years of age, have your blood pressure checked every 3-5 years. If you are 40 years of age or older, have your blood pressure checked every year. You should have your blood pressure measured twice-once when you are at a hospital or clinic, and once when   you are not at a hospital or clinic. Record the average of the two measurements. To check your blood pressure when you are not at a hospital or clinic, you can use: ? An automated blood pressure machine at a pharmacy. ? A home blood pressure monitor.  Talk to your health care provider about your target blood pressure.  If you are between 45-79 years old, ask your health care provider if you should take aspirin to prevent heart disease.  Have regular diabetes screenings by checking your fasting blood  sugar level. ? If you are at a normal weight and have a low risk for diabetes, have this test once every three years after the age of 45. ? If you are overweight and have a high risk for diabetes, consider being tested at a younger age or more often.  A one-time screening for abdominal aortic aneurysm (AAA) by ultrasound is recommended for men aged 65-75 years who are current or former smokers. What should I know about preventing infection? Hepatitis B If you have a higher risk for hepatitis B, you should be screened for this virus. Talk with your health care provider to find out if you are at risk for hepatitis B infection. Hepatitis C Blood testing is recommended for:  Everyone born from 1945 through 1965.  Anyone with known risk factors for hepatitis C.  Sexually Transmitted Diseases (STDs)  You should be screened each year for STDs including gonorrhea and chlamydia if: ? You are sexually active and are younger than 58 years of age. ? You are older than 58 years of age and your health care provider tells you that you are at risk for this type of infection. ? Your sexual activity has changed since you were last screened and you are at an increased risk for chlamydia or gonorrhea. Ask your health care provider if you are at risk.  Talk with your health care provider about whether you are at high risk of being infected with HIV. Your health care provider may recommend a prescription medicine to help prevent HIV infection.  What else can I do?  Schedule regular health, dental, and eye exams.  Stay current with your vaccines (immunizations).  Do not use any tobacco products, such as cigarettes, chewing tobacco, and e-cigarettes. If you need help quitting, ask your health care provider.  Limit alcohol intake to no more than 2 drinks per day. One drink equals 12 ounces of beer, 5 ounces of wine, or 1 ounces of hard liquor.  Do not use street drugs.  Do not share needles.  Ask your  health care provider for help if you need support or information about quitting drugs.  Tell your health care provider if you often feel depressed.  Tell your health care provider if you have ever been abused or do not feel safe at home. This information is not intended to replace advice given to you by your health care provider. Make sure you discuss any questions you have with your health care provider. Document Released: 03/21/2008 Document Revised: 05/22/2016 Document Reviewed: 06/27/2015 Elsevier Interactive Patient Education  2018 [employer]     IF you received an x-ray today, you will receive an invoice from [facility]. Please contact [facility] at [phone] with questions or concerns regarding your invoice.   IF you received labwork today, you will receive an invoice from [facility]. Please contact [facility] at [phone] with questions or concerns regarding your invoice.   Our billing staff will not be   able to assist you with questions regarding bills from these companies.  You will be contacted with the lab results as soon as they are available. The fastest way to get your results is to activate your My Chart account. Instructions are located on the last page of this paperwork. If you have not heard from us regarding the results in 2 weeks, please contact this office.       

## 2018-02-25 LAB — CBC
HEMATOCRIT: 47.4 % (ref 37.5–51.0)
Hemoglobin: 16.1 g/dL (ref 13.0–17.7)
MCH: 31.8 pg (ref 26.6–33.0)
MCHC: 34 g/dL (ref 31.5–35.7)
MCV: 94 fL (ref 79–97)
PLATELETS: 230 10*3/uL (ref 150–450)
RBC: 5.06 x10E6/uL (ref 4.14–5.80)
RDW: 13.8 % (ref 12.3–15.4)
WBC: 7.4 10*3/uL (ref 3.4–10.8)

## 2018-02-25 LAB — TESTOSTERONE FREE, PROFILE I
Sex Hormone Binding: 65.5 nmol/L (ref 19.3–76.4)
TESTOSTERONE: 342 ng/dL (ref 264–916)
Testost., Free, Calc: 41.6 pg/mL (ref 35.8–168.2)

## 2018-02-25 LAB — COMPREHENSIVE METABOLIC PANEL
ALBUMIN: 4.6 g/dL (ref 3.5–5.5)
ALT: 13 IU/L (ref 0–44)
AST: 19 IU/L (ref 0–40)
Albumin/Globulin Ratio: 1.7 (ref 1.2–2.2)
Alkaline Phosphatase: 105 IU/L (ref 39–117)
BUN / CREAT RATIO: 22 — AB (ref 9–20)
BUN: 20 mg/dL (ref 6–24)
Bilirubin Total: 0.5 mg/dL (ref 0.0–1.2)
CALCIUM: 9.7 mg/dL (ref 8.7–10.2)
CO2: 21 mmol/L (ref 20–29)
Chloride: 103 mmol/L (ref 96–106)
Creatinine, Ser: 0.93 mg/dL (ref 0.76–1.27)
GFR calc Af Amer: 105 mL/min/{1.73_m2} (ref >59)
GFR, EST NON AFRICAN AMERICAN: 91 mL/min/{1.73_m2} (ref >59)
GLOBULIN, TOTAL: 2.7 g/dL (ref 1.5–4.5)
Glucose: 105 mg/dL — ABNORMAL HIGH (ref 65–99)
Potassium: 5 mmol/L (ref 3.5–5.2)
SODIUM: 141 mmol/L (ref 134–144)
TOTAL PROTEIN: 7.3 g/dL (ref 6.0–8.5)

## 2018-02-25 LAB — HEPATITIS C ANTIBODY

## 2018-02-25 LAB — HIV ANTIBODY (ROUTINE TESTING W REFLEX): HIV Screen 4th Generation wRfx: NONREACTIVE

## 2018-02-25 LAB — TSH: TSH: 0.373 u[IU]/mL — AB (ref 0.450–4.500)

## 2018-02-25 LAB — LIPID PANEL
Chol/HDL Ratio: 4.1 ratio (ref 0.0–5.0)
Cholesterol, Total: 211 mg/dL — ABNORMAL HIGH (ref 100–199)
HDL: 52 mg/dL (ref >39)
LDL Calculated: 137 mg/dL — ABNORMAL HIGH (ref 0–99)
Triglycerides: 108 mg/dL (ref 0–149)
VLDL Cholesterol Cal: 22 mg/dL (ref 5–40)

## 2018-02-26 NOTE — Addendum Note (Signed)
Addended by: Benson Setting L on: 02/26/2018 03:25 PM   Modules accepted: Orders

## 2018-02-27 ENCOUNTER — Encounter: Payer: Self-pay | Admitting: Urgent Care

## 2018-03-04 LAB — SPECIMEN STATUS REPORT

## 2018-03-04 LAB — T3: T3, Total: 96 ng/dL (ref 71–180)

## 2018-03-04 LAB — T4, FREE: FREE T4: 1.68 ng/dL (ref 0.82–1.77)

## 2018-03-10 MED FILL — tiZANidine HCL 4 MG TABS: 4 | 30 days supply | Qty: 90 | Fill #0

## 2018-03-10 MED FILL — ATORVASTATIN 20 MG TABLET: 20 | 90 days supply | Qty: 90 | Fill #0

## 2018-03-23 MED FILL — DICLOFENAC SOD EC 75 MG TAB: 75 | 30 days supply | Qty: 60 | Fill #0

## 2018-03-24 MED FILL — IPRATROPIUM 0.03% SPRAY: 0.03 | 43 days supply | Qty: 30 | Fill #0

## 2018-03-26 MED FILL — AZITHROMYCIN 250 MG TABLET: 250 | 5 days supply | Qty: 6 | Fill #0

## 2018-04-13 MED FILL — MORPHINE SULFATE IR 15 MG T: 15 | 30 days supply | Qty: 120 | Fill #0

## 2018-04-16 MED FILL — tiZANidine HCL 4 MG TABS: 4 | 30 days supply | Qty: 90 | Fill #1

## 2018-04-28 MED FILL — DICLOFENAC SOD EC 75 MG TAB: 75 | 30 days supply | Qty: 60 | Fill #0

## 2018-04-28 MED FILL — IPRATROPIUM 0.03% SPRAY: 0.03 | 43 days supply | Qty: 30 | Fill #0

## 2018-05-13 MED FILL — MORPHINE SULFATE IR 15 MG T: 15 | 30 days supply | Qty: 120 | Fill #0

## 2018-06-01 MED FILL — ATORVASTATIN CALCIUM 20 MG: 20 | 90 days supply | Qty: 90 | Fill #1

## 2018-06-01 MED FILL — DICLOFENAC SOD EC 75 MG TAB: 75 | 30 days supply | Qty: 60 | Fill #1

## 2018-06-02 MED FILL — tiZANidine HCL 4 MG TABS: 4 | 30 days supply | Qty: 90 | Fill #0

## 2018-06-02 MED FILL — IPRATROPIUM 0.03% SPRAY: 0.03 | 43 days supply | Qty: 30 | Fill #1

## 2018-06-12 MED FILL — MORPHINE SULFATE IR 15 MG T: 15 | 30 days supply | Qty: 120 | Fill #0

## 2018-07-08 MED FILL — IPRATROPIUM 0.03% SPRAY: 0.03 | 43 days supply | Qty: 30 | Fill #2

## 2018-07-08 MED FILL — tiZANidine HCL 4 MG TABS: 4 | 30 days supply | Qty: 90 | Fill #1

## 2018-08-06 MED FILL — DICLOFENAC SOD EC 75 MG TAB: 75 | 30 days supply | Qty: 60 | Fill #0

## 2018-08-06 MED FILL — tiZANidine HCL 4 MG TABS: 4 | 30 days supply | Qty: 90 | Fill #0

## 2018-08-09 MED FILL — IPRATROPIUM 0.03% SPRAY: 0.03 | 43 days supply | Qty: 30 | Fill #0

## 2018-08-10 MED FILL — MORPHINE SULFATE IR 15 MG T: 15 | 30 days supply | Qty: 120 | Fill #0

## 2018-09-10 MED FILL — DICLOFENAC SODIUM 75 MG TAB: 75 | 30 days supply | Qty: 60 | Fill #0

## 2018-09-10 MED FILL — ATORVASTATIN CALCIUM 20 MG: 20 | 90 days supply | Qty: 90 | Fill #2

## 2018-09-10 MED FILL — MORPHINE SULFATE IR 15 MG T: 15 | 30 days supply | Qty: 120 | Fill #0

## 2018-09-10 MED FILL — tiZANidine HCL 4 MG TABS: 4 | 30 days supply | Qty: 90 | Fill #1

## 2018-09-10 MED FILL — IPRATROPIUM 0.03% SPRAY: 0.03 | 43 days supply | Qty: 30 | Fill #0

## 2018-10-08 MED FILL — MORPHINE SULFATE IR 15 MG T: 15 | 30 days supply | Qty: 120 | Fill #0

## 2018-10-08 MED FILL — tiZANidine HCL 4 MG TABS: 4 | 30 days supply | Qty: 90 | Fill #0

## 2018-10-13 MED FILL — DICLOFENAC SODIUM 75 MG TAB: 75 | 30 days supply | Qty: 60 | Fill #0

## 2018-10-14 MED FILL — IPRATROPIUM 0.03% SPRAY: 0.03 | 43 days supply | Qty: 30 | Fill #0

## 2018-11-09 MED FILL — MORPHINE SULFATE IR 15 MG T: 15 | 30 days supply | Qty: 120 | Fill #0

## 2018-11-12 MED FILL — tiZANidine HCL 4 MG TABS: 4 | 30 days supply | Qty: 90 | Fill #1

## 2018-11-12 MED FILL — DICLOFENAC SODIUM 75 MG TAB: 75 | 30 days supply | Qty: 60 | Fill #0

## 2018-11-20 DIAGNOSIS — R030 Elevated blood-pressure reading, without diagnosis of hypertension: Secondary | ICD-10-CM | POA: Diagnosis not present

## 2018-11-20 DIAGNOSIS — Z6830 Body mass index (BMI) 30.0-30.9, adult: Secondary | ICD-10-CM | POA: Diagnosis not present

## 2018-11-20 DIAGNOSIS — M542 Cervicalgia: Secondary | ICD-10-CM | POA: Diagnosis not present

## 2018-11-20 DIAGNOSIS — M545 Low back pain: Secondary | ICD-10-CM | POA: Diagnosis not present

## 2018-11-20 MED FILL — IPRATROPIUM 0.03% SPRAY: 0.03 | 43 days supply | Qty: 30 | Fill #1

## 2018-12-04 MED FILL — ATORVASTATIN 40 MG TABLET: 40 | 30 days supply | Qty: 30 | Fill #0

## 2018-12-08 MED FILL — MORPHINE SULFATE IR 15 MG T: 15 | 30 days supply | Qty: 120 | Fill #0

## 2018-12-21 MED FILL — IPRATROPIUM 0.03% SPRAY: 0.03 | 43 days supply | Qty: 30 | Fill #0

## 2019-01-04 MED FILL — ATORVASTATIN 40 MG TABLET: 40 | 30 days supply | Qty: 30 | Fill #1

## 2019-01-07 MED FILL — tiZANidine HCL 4 MG TABS: 4 | 30 days supply | Qty: 90 | Fill #0

## 2019-01-07 MED FILL — MORPHINE SULFATE IR 15 MG T: 15 | 30 days supply | Qty: 120 | Fill #0

## 2019-01-16 MED FILL — DICLOFENAC SODIUM 75 MG TAB: 75 | 30 days supply | Qty: 60 | Fill #0

## 2019-01-25 MED FILL — IPRATROPIUM 0.03% SPRAY: 0.03 | 43 days supply | Qty: 30 | Fill #1

## 2019-02-05 MED FILL — MORPHINE SULFATE IR 15 MG T: 15 | 30 days supply | Qty: 120 | Fill #0

## 2019-02-15 MED FILL — ATORVASTATIN 40 MG TABLET: 40 | 30 days supply | Qty: 30 | Fill #2

## 2019-02-22 MED FILL — DICLOFENAC SODIUM 75 MG TAB: 75 | 30 days supply | Qty: 60 | Fill #1

## 2019-02-22 MED FILL — tiZANidine HCL 4 MG TABS: 4 | 30 days supply | Qty: 90 | Fill #1

## 2019-02-26 DIAGNOSIS — M48061 Spinal stenosis, lumbar region without neurogenic claudication: Secondary | ICD-10-CM | POA: Diagnosis not present

## 2019-02-26 DIAGNOSIS — M545 Low back pain: Secondary | ICD-10-CM | POA: Diagnosis not present

## 2019-03-02 MED FILL — IPRATROPIUM 0.03% SPRAY: 0.03 | 43 days supply | Qty: 30 | Fill #2

## 2019-03-08 MED FILL — MORPHINE SULFATE IR 15 MG T: 15 | 30 days supply | Qty: 120 | Fill #0

## 2019-03-18 MED FILL — ATORVASTATIN 40 MG TABLET: 40 | 30 days supply | Qty: 30 | Fill #3

## 2019-03-19 MED FILL — tiZANidine HCL 4 MG TABS: 4 | 30 days supply | Qty: 90 | Fill #2

## 2019-04-05 MED FILL — MORPHINE SULFATE IR 15 MG T: 15 | 30 days supply | Qty: 120 | Fill #0

## 2019-04-05 MED FILL — IPRATROPIUM 0.03% SPRAY: 0.03 | 43 days supply | Qty: 30 | Fill #0

## 2019-04-06 DIAGNOSIS — E7800 Pure hypercholesterolemia, unspecified: Secondary | ICD-10-CM | POA: Diagnosis not present

## 2019-04-06 DIAGNOSIS — R030 Elevated blood-pressure reading, without diagnosis of hypertension: Secondary | ICD-10-CM | POA: Diagnosis not present

## 2019-04-06 DIAGNOSIS — R7303 Prediabetes: Secondary | ICD-10-CM | POA: Diagnosis not present

## 2019-04-06 DIAGNOSIS — J310 Chronic rhinitis: Secondary | ICD-10-CM | POA: Diagnosis not present

## 2019-04-06 DIAGNOSIS — F17200 Nicotine dependence, unspecified, uncomplicated: Secondary | ICD-10-CM | POA: Diagnosis not present

## 2019-04-06 DIAGNOSIS — E0590 Thyrotoxicosis, unspecified without thyrotoxic crisis or storm: Secondary | ICD-10-CM | POA: Diagnosis not present

## 2019-04-12 MED FILL — ATORVASTATIN 40 MG TABLET: 40 | 30 days supply | Qty: 30 | Fill #4

## 2019-04-12 MED FILL — DICLOFENAC SODIUM 75 MG TAB: 75 | 30 days supply | Qty: 60 | Fill #0

## 2019-04-12 MED FILL — tiZANidine HCL 4 MG TABS: 4 | 30 days supply | Qty: 90 | Fill #0

## 2019-04-21 DIAGNOSIS — E0590 Thyrotoxicosis, unspecified without thyrotoxic crisis or storm: Secondary | ICD-10-CM | POA: Diagnosis not present

## 2019-04-21 DIAGNOSIS — J310 Chronic rhinitis: Secondary | ICD-10-CM | POA: Diagnosis not present

## 2019-04-21 DIAGNOSIS — R030 Elevated blood-pressure reading, without diagnosis of hypertension: Secondary | ICD-10-CM | POA: Diagnosis not present

## 2019-04-21 DIAGNOSIS — R7303 Prediabetes: Secondary | ICD-10-CM | POA: Diagnosis not present

## 2019-04-21 DIAGNOSIS — F17200 Nicotine dependence, unspecified, uncomplicated: Secondary | ICD-10-CM | POA: Diagnosis not present

## 2019-04-21 DIAGNOSIS — E7800 Pure hypercholesterolemia, unspecified: Secondary | ICD-10-CM | POA: Diagnosis not present

## 2019-05-04 MED FILL — MORPHINE SULFATE IR 15 MG T: 15 | 30 days supply | Qty: 120 | Fill #0

## 2019-05-21 DIAGNOSIS — M542 Cervicalgia: Secondary | ICD-10-CM | POA: Diagnosis not present

## 2019-05-21 DIAGNOSIS — Z6828 Body mass index (BMI) 28.0-28.9, adult: Secondary | ICD-10-CM | POA: Diagnosis not present

## 2019-05-21 DIAGNOSIS — M545 Low back pain: Secondary | ICD-10-CM | POA: Diagnosis not present

## 2019-05-21 DIAGNOSIS — R030 Elevated blood-pressure reading, without diagnosis of hypertension: Secondary | ICD-10-CM | POA: Diagnosis not present

## 2019-05-24 MED FILL — IPRATROPIUM 0.03% SPRAY: 0.03 | 43 days supply | Qty: 30 | Fill #1

## 2019-05-24 MED FILL — ATORVASTATIN 40 MG TABLET: 40 | 30 days supply | Qty: 30 | Fill #5

## 2019-05-25 MED FILL — tiZANidine HCL 4 MG TABS: 4 | 30 days supply | Qty: 90 | Fill #1

## 2019-05-25 MED FILL — DICLOFENAC SODIUM 75 MG TAB: 75 | 30 days supply | Qty: 60 | Fill #0

## 2019-06-03 DIAGNOSIS — M542 Cervicalgia: Secondary | ICD-10-CM | POA: Diagnosis not present

## 2019-06-03 MED FILL — MORPHINE SULFATE IR 15 MG T: 15 | 30 days supply | Qty: 120 | Fill #0

## 2019-06-07 DIAGNOSIS — M50123 Cervical disc disorder at C6-C7 level with radiculopathy: Secondary | ICD-10-CM | POA: Diagnosis not present

## 2019-06-24 MED FILL — ATORVASTATIN 40 MG TABLET: 40 | 90 days supply | Qty: 90 | Fill #0

## 2019-07-02 MED FILL — MORPHINE SULFATE IR 15 MG T: 15 | 30 days supply | Qty: 120 | Fill #0

## 2019-07-21 MED FILL — tiZANidine HCL 4 MG TABS: 4 | 30 days supply | Qty: 90 | Fill #2

## 2019-07-21 MED FILL — DICLOFENAC SODIUM 75 MG TAB: 75 | 30 days supply | Qty: 60 | Fill #1

## 2019-07-29 DIAGNOSIS — M50123 Cervical disc disorder at C6-C7 level with radiculopathy: Secondary | ICD-10-CM | POA: Diagnosis not present

## 2019-07-31 MED FILL — MORPHINE SULFATE IR 15 MG T: 15 | 30 days supply | Qty: 120 | Fill #0

## 2019-08-17 ENCOUNTER — Other Ambulatory Visit: Payer: Self-pay | Admitting: Physician Assistant

## 2019-08-17 MED FILL — IPRATROPIUM 0.03% SPRAY: 0.03 | 43 days supply | Qty: 30 | Fill #1

## 2019-08-20 DIAGNOSIS — M545 Low back pain: Secondary | ICD-10-CM | POA: Diagnosis not present

## 2019-08-20 DIAGNOSIS — M5417 Radiculopathy, lumbosacral region: Secondary | ICD-10-CM | POA: Diagnosis not present

## 2019-08-20 DIAGNOSIS — R030 Elevated blood-pressure reading, without diagnosis of hypertension: Secondary | ICD-10-CM | POA: Diagnosis not present

## 2019-08-20 DIAGNOSIS — Z6828 Body mass index (BMI) 28.0-28.9, adult: Secondary | ICD-10-CM | POA: Diagnosis not present

## 2019-08-20 DIAGNOSIS — M50123 Cervical disc disorder at C6-C7 level with radiculopathy: Secondary | ICD-10-CM | POA: Diagnosis not present

## 2019-08-20 MED FILL — tiZANidine HCL 4 MG TABS: 4 | 30 days supply | Qty: 90 | Fill #0

## 2019-08-30 MED FILL — MORPHINE SULFATE 15 MG TABS: 15 | 30 days supply | Qty: 120 | Fill #0

## 2019-08-31 DIAGNOSIS — M545 Low back pain: Secondary | ICD-10-CM | POA: Diagnosis not present

## 2019-08-31 DIAGNOSIS — M5417 Radiculopathy, lumbosacral region: Secondary | ICD-10-CM | POA: Diagnosis not present

## 2019-09-06 MED FILL — DICLOFENAC SODIUM 75 MG TAB: 75 | 30 days supply | Qty: 60 | Fill #0

## 2019-09-08 DIAGNOSIS — M5417 Radiculopathy, lumbosacral region: Secondary | ICD-10-CM | POA: Diagnosis not present

## 2019-09-16 MED FILL — DICLOFENAC SODIUM 75 MG TAB: 75 | 30 days supply | Qty: 60 | Fill #0

## 2019-09-16 MED FILL — ATORVASTATIN 40 MG TABLET: 40 | 30 days supply | Qty: 30 | Fill #1

## 2019-09-16 MED FILL — tiZANidine HCL 4 MG TABS: 4 | 30 days supply | Qty: 90 | Fill #1

## 2019-09-28 MED FILL — MORPHINE SULFATE 15 MG TABS: 15 | 30 days supply | Qty: 120 | Fill #0

## 2019-10-05 DIAGNOSIS — M5417 Radiculopathy, lumbosacral region: Secondary | ICD-10-CM | POA: Diagnosis not present

## 2019-10-13 DIAGNOSIS — M5126 Other intervertebral disc displacement, lumbar region: Secondary | ICD-10-CM | POA: Diagnosis not present

## 2019-10-13 DIAGNOSIS — R030 Elevated blood-pressure reading, without diagnosis of hypertension: Secondary | ICD-10-CM | POA: Diagnosis not present

## 2019-10-13 DIAGNOSIS — Z6829 Body mass index (BMI) 29.0-29.9, adult: Secondary | ICD-10-CM | POA: Diagnosis not present

## 2019-10-18 DIAGNOSIS — Z01818 Encounter for other preprocedural examination: Secondary | ICD-10-CM | POA: Diagnosis not present

## 2019-10-19 MED FILL — ATORVASTATIN 40 MG TABLET: 40 | 30 days supply | Qty: 30 | Fill #0

## 2019-10-21 MED FILL — DICLOFENAC SODIUM 75 MG TAB: 75 | 30 days supply | Qty: 60 | Fill #0

## 2019-10-21 MED FILL — tiZANidine HCL 4 MG TABS: 4 | 30 days supply | Qty: 90 | Fill #2

## 2019-10-21 MED FILL — IPRATROPIUM 0.03% SPRAY: 0.03 | 43 days supply | Qty: 30 | Fill #0

## 2019-10-22 DIAGNOSIS — Z981 Arthrodesis status: Secondary | ICD-10-CM | POA: Diagnosis not present

## 2019-10-22 DIAGNOSIS — M5126 Other intervertebral disc displacement, lumbar region: Secondary | ICD-10-CM | POA: Diagnosis not present

## 2019-10-22 DIAGNOSIS — M5127 Other intervertebral disc displacement, lumbosacral region: Secondary | ICD-10-CM | POA: Diagnosis not present

## 2019-10-23 ENCOUNTER — Emergency Department (HOSPITAL_BASED_OUTPATIENT_CLINIC_OR_DEPARTMENT_OTHER)
Admission: EM | Admit: 2019-10-23 | Discharge: 2019-10-23 | Disposition: A | Discharge status: Home / Self Care | Payer: 59 | Attending: Emergency Medicine | Admitting: Emergency Medicine

## 2019-10-23 ENCOUNTER — Encounter (HOSPITAL_BASED_OUTPATIENT_CLINIC_OR_DEPARTMENT_OTHER): Payer: Self-pay | Admitting: Emergency Medicine

## 2019-10-23 ENCOUNTER — Other Ambulatory Visit: Payer: Self-pay

## 2019-10-23 DIAGNOSIS — Z5189 Encounter for other specified aftercare: Secondary | ICD-10-CM | POA: Insufficient documentation

## 2019-10-23 DIAGNOSIS — Z79899 Other long term (current) drug therapy: Secondary | ICD-10-CM | POA: Insufficient documentation

## 2019-10-23 DIAGNOSIS — L7622 Postprocedural hemorrhage and hematoma of skin and subcutaneous tissue following other procedure: Secondary | ICD-10-CM | POA: Diagnosis not present

## 2019-10-23 DIAGNOSIS — R041 Hemorrhage from throat: Secondary | ICD-10-CM | POA: Diagnosis not present

## 2019-10-23 DIAGNOSIS — F17210 Nicotine dependence, cigarettes, uncomplicated: Secondary | ICD-10-CM | POA: Insufficient documentation

## 2019-10-23 DIAGNOSIS — Z85528 Personal history of other malignant neoplasm of kidney: Secondary | ICD-10-CM | POA: Diagnosis not present

## 2019-10-23 DIAGNOSIS — R58 Hemorrhage, not elsewhere classified: Secondary | ICD-10-CM

## 2019-10-23 NOTE — Discharge Instructions (Signed)
Keep the dressing on until you see your doctor on Monday. If dressing needs to be changed due to bleeding, apply a new dressing with guaze.  Follow-up with your surgeon and or back in the ER if you develop fevers, severe worsening pain, thick white pus draining from the area.  Return to the emergency room if you develop numbness or weakness in your legs, loss of bowel bladder control, or any new, worsening, or concerning symptoms.

## 2019-10-23 NOTE — ED Provider Notes (Signed)
[facility] EMERGENCY DEPARTMENT Provider Note   CSN: [id number] Arrival date & time: 10/23/19  1327     History Chief Complaint  Patient presents with  . Post-op Problem    Billy Alexander is a 59 y.o. male presenting for evaluation of bleeding from his incision site.  Patient states he had back surgery yesterday with [facility] in [place].  He does not remember the name of his doctor (?Dr. Brien Few).  Patient states approximately 2 hours ago he stood up from his recliner, noticed there was some bleeding from his back.  He has not changed the dressing since yesterday.  He tried to call the office, but got no response.  He denies fevers, chills, nausea, vomiting.  He denies numbness or tingling.  He denies weakness in his legs.  He denies loss of bowel bladder control.  He denies pain around the site.  He takes morphine daily, has had no change since postop and is not having any worsening pain.  He is not on blood thinners.    HPI     Past Medical History:  Diagnosis Date  . Cancer [facility]) 2007   kidney    Patient Active Problem List   Diagnosis Date Noted  . Subclinical hyperthyroidism 06/11/2016  . Hyperlipidemia   . Pre-diabetes   . Chest pain 07/05/2015  . Tobacco use disorder   . Dyspnea   . Scoliosis of lumbar spine 03/18/2014  . Smoker 08/29/2012    Past Surgical History:  Procedure Laterality Date  . CERVICAL SPINE SURGERY  01/2014 and 2007  . EYE SURGERY Bilateral 2011   Lasik  . NEPHRECTOMY Left 2007       Family History  Problem Relation Age of Onset  . Diabetes Father     Social History   Tobacco Use  . Smoking status: Current Every Day Smoker    Packs/day: 1.00    Years: 43.00    Pack years: 43.00    Types: Cigarettes    Start date: 07/12/1974  . Smokeless tobacco: Never Used  Substance Use Topics  . Alcohol use: No  . Drug use: No    Home Medications Prior to Admission medications   Medication Sig  Start Date End Date Taking? Authorizing Provider  HYDROcodone-acetaminophen (NORCO) 10-325 MG tablet 1 tablet as needed    [provider]  ipratropium (ATROVENT) 0.03 % nasal spray 2 sprays in each nostril 10/24/17   [provider]  ipratropium (ATROVENT) 0.03 % nasal spray  02/15/18   [provider]  morphine (KADIAN) 100 MG 24 hr capsule Take 100 mg by mouth daily.    [provider]  morphine (MSIR) 15 MG tablet Take 15 mg by mouth every 6 (six) hours as needed. 02/13/18   [provider]  tiZANidine (ZANAFLEX) 4 MG tablet 1 tablet as needed    [provider]    Allergies    Patient has no known allergies.  Review of Systems   Review of Systems  Skin:       Bleeding from back incision   All other systems reviewed and are negative.   Physical Exam Updated Vital Signs BP 125/76 (BP Location: Right Arm)   Pulse 63   Temp 98.1 F (36.7 C) (Oral)   Resp 18   Ht 5\' 11"  (1.803 m)   Wt 90.7 kg   SpO2 98%   BMI 27.89 kg/m   Physical Exam Vitals and nursing note reviewed.  Constitutional:      General: He is not in acute distress.    Appearance: He is well-developed.     Comments: Sitting comfortably in the bed in no acute distress  HENT:     Head: Normocephalic and atraumatic.  Eyes:     Extraocular Movements: Extraocular movements intact.     Conjunctiva/sclera: Conjunctivae normal.     Pupils: Pupils are equal, round, and reactive to light.  Cardiovascular:     Rate and Rhythm: Normal rate and regular rhythm.  Pulmonary:     Effort: Pulmonary effort is normal. No respiratory distress.     Breath sounds: Normal breath sounds. No wheezing.  Abdominal:     General: There is no distension.     Palpations: Abdomen is soft. There is no mass.     Tenderness: There is no abdominal tenderness. There is no guarding or rebound.  Musculoskeletal:        General: Normal range of motion.     Cervical back: Normal range of  motion and neck supple.     Comments: Low back midline incision without dehiscence.  No surrounding erythema, tenderness, purulent drainage.  No signs of infection.  Mild intermittent bleeding from the upper part of the incision site. No tenderness palpation of the low back.  Strength of lower extremities intact bilaterally.  Good distal cap refill.  Good distal sensation.  Patellar reflexes 2+ bilaterally.  No saddle paresthesias.  Patient able to go from sitting to standing and ambulate without difficulty.  Skin:    General: Skin is warm and dry.     Capillary Refill: Capillary refill takes less than 2 seconds.  Neurological:     Mental Status: He is alert and oriented to person, place, and time.     ED Results / Procedures / Treatments   Labs (all labs ordered are listed, but only abnormal results are displayed) Labs Reviewed - No data to display  EKG None  Radiology No results found.  Procedures Procedures (including critical care time)  Medications Ordered in ED Medications - No data to display  ED Course  I have reviewed the triage vital signs and the nursing notes.  Pertinent labs & imaging results that were available during my care of the patient were reviewed by me and considered in my medical decision making (see chart for details).    MDM Rules/Calculators/A&P                      Patient presenting for evaluation of bleeding from incision site from surgery yesterday.  Physical exam reassuring, no sign of infection.  No neurologic deficits.  Wound without dehiscence.  Patient states he was wearing his wound at the time, likely trauma causing bleeding.  Consider bleeding, hematoma.  He is not having life-threatening bleeding at this time.  I do not believe labs or emergent imaging would be beneficial at this time.  Case discussed with attending, Dr. Ronnald Nian agrees to plan.  I discussed with pt importance of close follow-up with the surgeon.  At this time, patient  appears safe for discharge.  Return precautions given.  Patient states he understands and agrees to plan.  Final Clinical Impression(s) / ED Diagnoses Final diagnoses:  Bleeding  Visit for wound check    Rx / DC Orders ED Discharge Orders    None       Franchot Heidelberg, PA-C 10/23/19 [place], [place], DO 10/23/19 1543

## 2019-10-23 NOTE — ED Triage Notes (Signed)
PT states that he has surgery to his back yesterday  - the patient states that he started to have bleeding in his incision  earlier today

## 2019-10-28 MED FILL — MORPHINE SULFATE 15 MG TABS: 15 | 30 days supply | Qty: 120 | Fill #0

## 2019-11-12 DIAGNOSIS — M5126 Other intervertebral disc displacement, lumbar region: Secondary | ICD-10-CM | POA: Diagnosis not present

## 2019-11-12 DIAGNOSIS — R030 Elevated blood-pressure reading, without diagnosis of hypertension: Secondary | ICD-10-CM | POA: Diagnosis not present

## 2019-11-12 DIAGNOSIS — M5417 Radiculopathy, lumbosacral region: Secondary | ICD-10-CM | POA: Diagnosis not present

## 2019-11-12 DIAGNOSIS — M50123 Cervical disc disorder at C6-C7 level with radiculopathy: Secondary | ICD-10-CM | POA: Diagnosis not present

## 2019-11-12 DIAGNOSIS — Z6829 Body mass index (BMI) 29.0-29.9, adult: Secondary | ICD-10-CM | POA: Diagnosis not present

## 2019-11-17 DIAGNOSIS — M5126 Other intervertebral disc displacement, lumbar region: Secondary | ICD-10-CM | POA: Diagnosis not present

## 2019-11-17 DIAGNOSIS — R030 Elevated blood-pressure reading, without diagnosis of hypertension: Secondary | ICD-10-CM | POA: Diagnosis not present

## 2019-11-17 DIAGNOSIS — M50123 Cervical disc disorder at C6-C7 level with radiculopathy: Secondary | ICD-10-CM | POA: Diagnosis not present

## 2019-11-18 MED FILL — ATORVASTATIN 40 MG TABLET: 40 | 30 days supply | Qty: 30 | Fill #1

## 2019-11-22 MED FILL — tiZANidine HCL 4 MG TABS: 4 | 30 days supply | Qty: 90 | Fill #0

## 2019-11-22 MED FILL — DICLOFENAC SODIUM 75 MG TAB: 75 | 30 days supply | Qty: 60 | Fill #1

## 2019-11-24 DIAGNOSIS — Z01818 Encounter for other preprocedural examination: Secondary | ICD-10-CM | POA: Diagnosis not present

## 2019-11-24 MED FILL — IPRATROPIUM 0.03% SPRAY: 0.03 | 43 days supply | Qty: 30 | Fill #1

## 2019-11-27 MED FILL — MORPHINE SULFATE 15 MG TABS: 15 | 30 days supply | Qty: 120 | Fill #0

## 2019-11-30 DIAGNOSIS — M5412 Radiculopathy, cervical region: Secondary | ICD-10-CM | POA: Diagnosis not present

## 2019-11-30 DIAGNOSIS — M4802 Spinal stenosis, cervical region: Secondary | ICD-10-CM | POA: Diagnosis not present

## 2019-11-30 DIAGNOSIS — M50123 Cervical disc disorder at C6-C7 level with radiculopathy: Secondary | ICD-10-CM | POA: Diagnosis not present

## 2019-12-15 DIAGNOSIS — M50123 Cervical disc disorder at C6-C7 level with radiculopathy: Secondary | ICD-10-CM | POA: Diagnosis not present

## 2019-12-15 DIAGNOSIS — Z6828 Body mass index (BMI) 28.0-28.9, adult: Secondary | ICD-10-CM | POA: Diagnosis not present

## 2019-12-15 MED FILL — ATORVASTATIN 40 MG TABLET: 40 | 30 days supply | Qty: 30 | Fill #2

## 2019-12-16 MED FILL — DICLOFENAC SODIUM 75 MG TAB: 75 | 30 days supply | Qty: 60 | Fill #1

## 2019-12-16 MED FILL — tiZANidine HCL 4 MG TABS: 4 | 30 days supply | Qty: 90 | Fill #1

## 2019-12-27 MED FILL — MORPHINE SULFATE 15 MG TABS: 15 | 30 days supply | Qty: 120 | Fill #0

## 2020-01-12 DIAGNOSIS — M50123 Cervical disc disorder at C6-C7 level with radiculopathy: Secondary | ICD-10-CM | POA: Diagnosis not present

## 2020-01-12 DIAGNOSIS — Z6828 Body mass index (BMI) 28.0-28.9, adult: Secondary | ICD-10-CM | POA: Diagnosis not present

## 2020-01-12 DIAGNOSIS — I10 Essential (primary) hypertension: Secondary | ICD-10-CM | POA: Diagnosis not present

## 2020-01-18 MED FILL — ATORVASTATIN 40 MG TABLET: 40 | 30 days supply | Qty: 30 | Fill #0

## 2020-01-18 MED FILL — DICLOFENAC SODIUM 75 MG TAB: 75 | 30 days supply | Qty: 60 | Fill #0

## 2020-01-18 MED FILL — IPRATROPIUM 0.03% SPRAY: 0.03 | 43 days supply | Qty: 30 | Fill #2

## 2020-01-19 MED FILL — tiZANidine HCL 4 MG TABS: 4 | 30 days supply | Qty: 90 | Fill #2

## 2020-01-25 MED FILL — MORPHINE SULFATE 15 MG TABS: 15 | 30 days supply | Qty: 120 | Fill #0

## 2020-02-15 DIAGNOSIS — F17200 Nicotine dependence, unspecified, uncomplicated: Secondary | ICD-10-CM | POA: Diagnosis not present

## 2020-02-15 DIAGNOSIS — R739 Hyperglycemia, unspecified: Secondary | ICD-10-CM | POA: Diagnosis not present

## 2020-02-15 DIAGNOSIS — M5416 Radiculopathy, lumbar region: Secondary | ICD-10-CM | POA: Diagnosis not present

## 2020-02-15 DIAGNOSIS — E0590 Thyrotoxicosis, unspecified without thyrotoxic crisis or storm: Secondary | ICD-10-CM | POA: Diagnosis not present

## 2020-02-15 DIAGNOSIS — E7800 Pure hypercholesterolemia, unspecified: Secondary | ICD-10-CM | POA: Diagnosis not present

## 2020-02-15 DIAGNOSIS — M5090 Cervical disc disorder, unspecified, unspecified cervical region: Secondary | ICD-10-CM | POA: Diagnosis not present

## 2020-02-15 DIAGNOSIS — R030 Elevated blood-pressure reading, without diagnosis of hypertension: Secondary | ICD-10-CM | POA: Diagnosis not present

## 2020-02-21 MED FILL — IPRATROPIUM 0.03% SPRAY: 0.03 | 43 days supply | Qty: 30 | Fill #3

## 2020-02-24 DIAGNOSIS — M50123 Cervical disc disorder at C6-C7 level with radiculopathy: Secondary | ICD-10-CM | POA: Diagnosis not present

## 2020-02-24 DIAGNOSIS — M48061 Spinal stenosis, lumbar region without neurogenic claudication: Secondary | ICD-10-CM | POA: Diagnosis not present

## 2020-02-24 MED FILL — MORPHINE SULFATE 15 MG TABS: 15 | 30 days supply | Qty: 120 | Fill #0

## 2020-02-24 MED FILL — tiZANidine HCL 4 MG TABS: 4 | 30 days supply | Qty: 90 | Fill #0

## 2020-02-24 MED FILL — GABAPENTIN 600 MG TABLET: 600 | 30 days supply | Qty: 90 | Fill #0

## 2020-02-28 MED FILL — DICLOFENAC SODIUM 75 MG TAB: 75 | 30 days supply | Qty: 60 | Fill #0

## 2020-03-24 MED FILL — DICLOFENAC SODIUM 75 MG TAB: 75 | 30 days supply | Qty: 60 | Fill #1

## 2020-03-24 MED FILL — GABAPENTIN 600 MG TABLET: 600 | 30 days supply | Qty: 90 | Fill #1

## 2020-03-24 MED FILL — tiZANidine HCL 4 MG TABS: 4 | 30 days supply | Qty: 90 | Fill #1

## 2020-03-24 MED FILL — ATORVASTATIN CALCIUM 40 MG: 40 | 30 days supply | Qty: 30 | Fill #0

## 2020-03-25 MED FILL — MORPHINE SULFATE 15 MG TABS: 15 | 30 days supply | Qty: 120 | Fill #0

## 2020-03-27 MED FILL — IPRATROPIUM 0.03% SPRAY: 0.03 | 43 days supply | Qty: 30 | Fill #2

## 2020-04-10 DIAGNOSIS — J22 Unspecified acute lower respiratory infection: Secondary | ICD-10-CM | POA: Diagnosis not present

## 2020-04-10 MED FILL — IPRATROPIUM 0.03% SPRAY: 0.03 | 43 days supply | Qty: 30 | Fill #0

## 2020-04-11 DIAGNOSIS — Z6829 Body mass index (BMI) 29.0-29.9, adult: Secondary | ICD-10-CM | POA: Diagnosis not present

## 2020-04-11 DIAGNOSIS — J019 Acute sinusitis, unspecified: Secondary | ICD-10-CM | POA: Diagnosis not present

## 2020-04-24 MED FILL — MORPHINE SULFATE 15 MG TABS: 15 | 30 days supply | Qty: 120 | Fill #0

## 2020-04-25 MED FILL — ATORVASTATIN CALCIUM 40 MG: 40 | 30 days supply | Qty: 30 | Fill #1

## 2020-05-08 MED FILL — tiZANidine HCL 4 MG TABS: 4 | 30 days supply | Qty: 90 | Fill #2

## 2020-05-08 MED FILL — GABAPENTIN 600 MG TABLET: 600 | 30 days supply | Qty: 90 | Fill #2

## 2020-05-22 MED FILL — IPRATROPIUM 0.03% SPRAY: 0.03 | 43 days supply | Qty: 30 | Fill #1

## 2020-05-23 ENCOUNTER — Other Ambulatory Visit (HOSPITAL_COMMUNITY): Payer: Self-pay | Admitting: Neurosurgery

## 2020-05-23 DIAGNOSIS — M50123 Cervical disc disorder at C6-C7 level with radiculopathy: Secondary | ICD-10-CM | POA: Diagnosis not present

## 2020-05-23 DIAGNOSIS — M48061 Spinal stenosis, lumbar region without neurogenic claudication: Secondary | ICD-10-CM | POA: Diagnosis not present

## 2020-05-23 MED FILL — MORPHINE SULFATE 15 MG TABS: 15 | 30 days supply | Qty: 120 | Fill #0

## 2020-06-01 MED FILL — GABAPENTIN 600 MG TABLET: 600 | 30 days supply | Qty: 90 | Fill #0

## 2020-06-01 MED FILL — tiZANidine HCL 4 MG TABS: 4 | 30 days supply | Qty: 90 | Fill #0

## 2020-06-02 ENCOUNTER — Other Ambulatory Visit (HOSPITAL_COMMUNITY): Payer: Self-pay | Admitting: Family Medicine

## 2020-06-02 DIAGNOSIS — R739 Hyperglycemia, unspecified: Secondary | ICD-10-CM | POA: Diagnosis not present

## 2020-06-02 DIAGNOSIS — E7800 Pure hypercholesterolemia, unspecified: Secondary | ICD-10-CM | POA: Diagnosis not present

## 2020-06-02 DIAGNOSIS — E0590 Thyrotoxicosis, unspecified without thyrotoxic crisis or storm: Secondary | ICD-10-CM | POA: Diagnosis not present

## 2020-06-02 DIAGNOSIS — F17200 Nicotine dependence, unspecified, uncomplicated: Secondary | ICD-10-CM | POA: Diagnosis not present

## 2020-06-02 DIAGNOSIS — R030 Elevated blood-pressure reading, without diagnosis of hypertension: Secondary | ICD-10-CM | POA: Diagnosis not present

## 2020-06-02 MED FILL — ATORVASTATIN CALCIUM 40 MG: 40 | 90 days supply | Qty: 90 | Fill #0

## 2020-06-02 MED FILL — DICLOFENAC SODIUM 75 MG TAB: 75 | 30 days supply | Qty: 60 | Fill #0

## 2020-06-22 MED FILL — MORPHINE SULFATE 15 MG TABS: 15 | 30 days supply | Qty: 120 | Fill #0

## 2020-07-19 MED FILL — tiZANidine HCL 4 MG TABS: 4 | 30 days supply | Qty: 90 | Fill #1

## 2020-07-19 MED FILL — GABAPENTIN 600 MG TABLET: 600 | 30 days supply | Qty: 90 | Fill #1

## 2020-07-19 MED FILL — IPRATROPIUM 0.03% SPRAY: 0.03 | 43 days supply | Qty: 30 | Fill #0

## 2020-07-19 MED FILL — DICLOFENAC SODIUM 75 MG TAB: 75 | 30 days supply | Qty: 60 | Fill #1

## 2020-07-21 MED FILL — MORPHINE SULFATE 15 MG TABS: 15 | 30 days supply | Qty: 120 | Fill #0

## 2020-08-18 ENCOUNTER — Other Ambulatory Visit (HOSPITAL_COMMUNITY): Payer: Self-pay | Admitting: Neurosurgery

## 2020-08-18 MED FILL — GABAPENTIN 600 MG TABLET: 600 | 30 days supply | Qty: 90 | Fill #0

## 2020-08-18 MED FILL — DICLOFENAC SODIUM 75 MG TAB: 75 | 30 days supply | Qty: 60 | Fill #2

## 2020-08-18 MED FILL — tiZANidine HCL 4 MG TABS: 4 | 30 days supply | Qty: 90 | Fill #0

## 2020-08-19 MED FILL — MORPHINE SULFATE 15 MG TABS: 15 | 30 days supply | Qty: 120 | Fill #0

## 2020-08-26 MED FILL — IPRATROPIUM 0.03% SPRAY: 0.03 | 43 days supply | Qty: 30 | Fill #1

## 2020-09-05 MED FILL — ATORVASTATIN 40 MG TABLET: 40 | 90 days supply | Qty: 90 | Fill #1

## 2020-09-18 MED FILL — GABAPENTIN 600 MG TABLET: 600 | 30 days supply | Qty: 90 | Fill #1

## 2020-09-18 MED FILL — MORPHINE SULFATE 15 MG TABS: 15 | 30 days supply | Qty: 120 | Fill #0

## 2020-09-18 MED FILL — tiZANidine HCL 4 MG TABS: 4 | 30 days supply | Qty: 90 | Fill #1

## 2020-09-18 MED FILL — DICLOFENAC SODIUM 75 MG TAB: 75 | 30 days supply | Qty: 60 | Fill #3

## 2020-10-13 ENCOUNTER — Other Ambulatory Visit (HOSPITAL_COMMUNITY): Payer: Self-pay | Admitting: Family Medicine

## 2020-10-13 MED FILL — DICLOFENAC SODIUM 75 MG TAB: 75 | 30 days supply | Qty: 60 | Fill #0

## 2020-10-16 MED FILL — MORPHINE SULFATE IR 15 MG T: 15 | 30 days supply | Qty: 120 | Fill #0

## 2020-10-17 MED FILL — GABAPENTIN 600 MG TABLET: 600 | 30 days supply | Qty: 90 | Fill #2

## 2020-10-19 MED FILL — IPRATROPIUM 0.03% SPRAY: 0.03 | 43 days supply | Qty: 30 | Fill #2

## 2020-10-19 MED FILL — tiZANidine HCL 4 MG TABS: 4 | 30 days supply | Qty: 90 | Fill #2

## 2020-11-13 ENCOUNTER — Other Ambulatory Visit (HOSPITAL_COMMUNITY): Payer: Self-pay | Admitting: Neurosurgery

## 2020-11-13 MED FILL — DICLOFENAC SODIUM 75 MG TAB: 75 | 30 days supply | Qty: 60 | Fill #1

## 2020-11-15 ENCOUNTER — Other Ambulatory Visit (HOSPITAL_COMMUNITY): Payer: Self-pay | Admitting: Neurosurgery

## 2020-11-15 MED FILL — GABAPENTIN 600 MG TABLET: 600 | 30 days supply | Qty: 90 | Fill #0

## 2020-11-15 MED FILL — tiZANidine HCL 4 MG TABS: 4 | 30 days supply | Qty: 90 | Fill #2

## 2020-11-15 MED FILL — MORPHINE SULFATE IR 15 MG T: 15 | 30 days supply | Qty: 120 | Fill #0

## 2020-11-24 ENCOUNTER — Other Ambulatory Visit (HOSPITAL_COMMUNITY): Payer: Self-pay | Admitting: Neurosurgery

## 2020-11-24 MED FILL — GABAPENTIN 600 MG TABLET: 600 | 30 days supply | Qty: 90 | Fill #0

## 2020-12-12 MED FILL — tiZANidine HCL 4 MG TABS: 4 | 30 days supply | Qty: 90 | Fill #0

## 2020-12-12 MED FILL — GABAPENTIN 600 MG TABLET: 600 | 30 days supply | Qty: 90 | Fill #0

## 2020-12-12 MED FILL — MORPHINE SULFATE IR 15 MG T: 15 | 30 days supply | Qty: 120 | Fill #0

## 2020-12-12 MED FILL — ATORVASTATIN 40 MG TABLET: 40 | 90 days supply | Qty: 90 | Fill #2

## 2020-12-12 MED FILL — IPRATROPIUM 0.03% SPRAY: 0.03 | 43 days supply | Qty: 30 | Fill #3

## 2021-01-10 ENCOUNTER — Other Ambulatory Visit (HOSPITAL_COMMUNITY): Payer: Self-pay

## 2021-01-10 MED FILL — Tizanidine HCl Tab 4 MG (Base Equivalent): ORAL | 30 days supply | Qty: 90 | Fill #0 | Status: CN

## 2021-01-10 MED FILL — Gabapentin Tab 600 MG: ORAL | 30 days supply | Qty: 90 | Fill #0 | Status: CN

## 2021-01-10 MED FILL — Ipratropium Bromide Nasal Soln 0.03% (21 MCG/SPRAY): NASAL | 30 days supply | Qty: 30 | Fill #0 | Status: CN

## 2021-01-11 ENCOUNTER — Other Ambulatory Visit (HOSPITAL_COMMUNITY): Payer: Self-pay

## 2021-01-11 MED ORDER — DICLOFENAC SODIUM 75 MG PO TBEC
DELAYED_RELEASE_TABLET | ORAL | 2 refills | Status: AC
  Filled 2021-01-11 – 2021-02-13 (×2): qty 60, 30d supply, fill #0

## 2021-01-12 ENCOUNTER — Other Ambulatory Visit (HOSPITAL_COMMUNITY): Payer: Self-pay

## 2021-01-12 MED FILL — Morphine Sulfate Tab 15 MG: ORAL | 30 days supply | Qty: 120 | Fill #0 | Status: AC

## 2021-01-12 MED FILL — Gabapentin Tab 600 MG: ORAL | 30 days supply | Qty: 90 | Fill #0 | Status: CN

## 2021-02-01 ENCOUNTER — Other Ambulatory Visit (HOSPITAL_COMMUNITY): Payer: Self-pay

## 2021-02-02 ENCOUNTER — Other Ambulatory Visit (HOSPITAL_COMMUNITY): Payer: Self-pay

## 2021-02-02 ENCOUNTER — Other Ambulatory Visit (HOSPITAL_COMMUNITY): Payer: Self-pay | Admitting: Neurosurgery

## 2021-02-02 MED ORDER — MORPHINE SULFATE 15 MG PO TABS
15.0000 mg | ORAL_TABLET | Freq: Four times a day (QID) | ORAL | 0 refills | Status: AC | PRN
  Filled 2021-03-12: qty 120, 30d supply, fill #0

## 2021-02-02 MED ORDER — MORPHINE SULFATE 15 MG PO TABS
15.0000 mg | ORAL_TABLET | Freq: Four times a day (QID) | ORAL | 0 refills | Status: AC | PRN
  Filled 2021-04-10: qty 120, 30d supply, fill #0

## 2021-02-02 MED ORDER — TIZANIDINE HCL 4 MG PO TABS
4.0000 mg | ORAL_TABLET | Freq: Three times a day (TID) | ORAL | 2 refills | Status: AC | PRN
  Filled 2021-02-02: qty 90, 30d supply, fill #0

## 2021-02-02 MED ORDER — GABAPENTIN 600 MG PO TABS
600.0000 mg | ORAL_TABLET | Freq: Three times a day (TID) | ORAL | 2 refills | Status: AC
  Filled 2021-02-02: qty 90, 30d supply, fill #0

## 2021-02-02 MED FILL — Tizanidine HCl Tab 4 MG (Base Equivalent): ORAL | 30 days supply | Qty: 90 | Fill #0 | Status: CN

## 2021-02-02 MED FILL — Ipratropium Bromide Nasal Soln 0.03% (21 MCG/SPRAY): NASAL | 30 days supply | Qty: 30 | Fill #0 | Status: CN

## 2021-02-03 ENCOUNTER — Other Ambulatory Visit (HOSPITAL_COMMUNITY): Payer: Self-pay

## 2021-02-06 ENCOUNTER — Other Ambulatory Visit (HOSPITAL_COMMUNITY): Payer: Self-pay

## 2021-02-08 ENCOUNTER — Other Ambulatory Visit (HOSPITAL_COMMUNITY): Payer: Self-pay

## 2021-02-10 ENCOUNTER — Other Ambulatory Visit (HOSPITAL_COMMUNITY): Payer: Self-pay

## 2021-02-10 MED FILL — Morphine Sulfate Tab 15 MG: ORAL | 30 days supply | Qty: 120 | Fill #0 | Status: AC

## 2021-02-12 ENCOUNTER — Other Ambulatory Visit (HOSPITAL_COMMUNITY): Payer: Self-pay

## 2021-02-13 ENCOUNTER — Other Ambulatory Visit (HOSPITAL_COMMUNITY): Payer: Self-pay

## 2021-02-21 ENCOUNTER — Other Ambulatory Visit (HOSPITAL_COMMUNITY): Payer: Self-pay

## 2021-03-12 ENCOUNTER — Other Ambulatory Visit (HOSPITAL_COMMUNITY): Payer: Self-pay

## 2021-03-22 ENCOUNTER — Other Ambulatory Visit (HOSPITAL_COMMUNITY): Payer: Self-pay

## 2021-04-05 ENCOUNTER — Other Ambulatory Visit (HOSPITAL_COMMUNITY): Payer: Self-pay

## 2021-04-10 ENCOUNTER — Other Ambulatory Visit (HOSPITAL_COMMUNITY): Payer: Self-pay

## 2021-05-02 ENCOUNTER — Other Ambulatory Visit (HOSPITAL_COMMUNITY): Payer: Self-pay

## 2021-05-02 MED ORDER — TIZANIDINE HCL 4 MG PO TABS
ORAL_TABLET | ORAL | 2 refills | Status: AC
  Filled 2021-05-02: qty 90, 30d supply, fill #0

## 2021-05-02 MED ORDER — GABAPENTIN 600 MG PO TABS
ORAL_TABLET | ORAL | 2 refills | Status: AC
  Filled 2021-05-02: qty 90, 30d supply, fill #0

## 2021-05-02 MED ORDER — MORPHINE SULFATE 15 MG PO TABS
ORAL_TABLET | ORAL | 0 refills | Status: AC
  Filled 2021-05-02: qty 112, 28d supply, fill #0

## 2023-03-28 ENCOUNTER — Encounter: Payer: Self-pay | Admitting: Family Medicine

## 2023-03-28 ENCOUNTER — Other Ambulatory Visit: Payer: Self-pay | Admitting: Family Medicine

## 2023-03-28 DIAGNOSIS — F17200 Nicotine dependence, unspecified, uncomplicated: Secondary | ICD-10-CM
